# Patient Record
Sex: Female | Born: 1970 | Race: Black or African American | Hispanic: No | Marital: Single | Smoking: Former smoker
Health system: Southern US, Community
[De-identification: ages and names within clinical notes are randomized; demographics above are authoritative.]

## PROBLEM LIST (undated history)

## (undated) ENCOUNTER — Emergency Department (HOSPITAL_COMMUNITY): Payer: BC Managed Care – PPO

## (undated) DIAGNOSIS — I729 Aneurysm of unspecified site: Secondary | ICD-10-CM

## (undated) DIAGNOSIS — E119 Type 2 diabetes mellitus without complications: Secondary | ICD-10-CM

## (undated) DIAGNOSIS — E785 Hyperlipidemia, unspecified: Secondary | ICD-10-CM

## (undated) DIAGNOSIS — K219 Gastro-esophageal reflux disease without esophagitis: Secondary | ICD-10-CM

## (undated) DIAGNOSIS — R42 Dizziness and giddiness: Secondary | ICD-10-CM

## (undated) HISTORY — DX: Gastro-esophageal reflux disease without esophagitis: K21.9

## (undated) HISTORY — PX: WISDOM TOOTH EXTRACTION: SHX21

## (undated) HISTORY — DX: Aneurysm of unspecified site: I72.9

## (undated) HISTORY — PX: CHOLECYSTECTOMY: SHX55

## (undated) HISTORY — PX: POLYPECTOMY: SHX149

## (undated) HISTORY — PX: COLONOSCOPY: SHX174

## (undated) HISTORY — DX: Type 2 diabetes mellitus without complications: E11.9

## (undated) HISTORY — PX: HERNIA REPAIR: SHX51

## (undated) HISTORY — DX: Hyperlipidemia, unspecified: E78.5

## (undated) HISTORY — DX: Dizziness and giddiness: R42

---

## 1999-04-25 ENCOUNTER — Emergency Department (HOSPITAL_COMMUNITY): Admission: EM | Admit: 1999-04-25 | Discharge: 1999-04-25 | Payer: Self-pay | Admitting: Emergency Medicine

## 2003-10-13 ENCOUNTER — Encounter: Payer: Self-pay | Admitting: Gastroenterology

## 2003-11-15 ENCOUNTER — Observation Stay (HOSPITAL_COMMUNITY): Admission: RE | Admit: 2003-11-15 | Discharge: 2003-11-16 | Payer: Self-pay | Admitting: Surgery

## 2003-11-15 ENCOUNTER — Encounter (INDEPENDENT_AMBULATORY_CARE_PROVIDER_SITE_OTHER): Payer: Self-pay | Admitting: Specialist

## 2004-05-02 ENCOUNTER — Ambulatory Visit: Payer: Self-pay | Admitting: Internal Medicine

## 2004-10-03 ENCOUNTER — Ambulatory Visit: Payer: Self-pay | Admitting: Internal Medicine

## 2004-11-28 ENCOUNTER — Ambulatory Visit: Payer: Self-pay | Admitting: Internal Medicine

## 2005-12-12 ENCOUNTER — Ambulatory Visit: Payer: Self-pay | Admitting: Internal Medicine

## 2006-02-18 ENCOUNTER — Ambulatory Visit: Payer: Self-pay | Admitting: Internal Medicine

## 2006-02-27 ENCOUNTER — Ambulatory Visit: Payer: Self-pay | Admitting: *Deleted

## 2006-03-06 ENCOUNTER — Ambulatory Visit: Payer: Self-pay | Admitting: *Deleted

## 2006-03-08 ENCOUNTER — Ambulatory Visit: Payer: Self-pay | Admitting: *Deleted

## 2006-12-30 ENCOUNTER — Emergency Department (HOSPITAL_COMMUNITY): Admission: EM | Admit: 2006-12-30 | Discharge: 2006-12-30 | Payer: Self-pay | Admitting: Emergency Medicine

## 2007-02-11 ENCOUNTER — Encounter: Admission: RE | Admit: 2007-02-11 | Discharge: 2007-02-11 | Payer: Self-pay | Admitting: Obstetrics and Gynecology

## 2007-09-04 ENCOUNTER — Ambulatory Visit: Payer: Self-pay | Admitting: Internal Medicine

## 2007-09-04 DIAGNOSIS — K219 Gastro-esophageal reflux disease without esophagitis: Secondary | ICD-10-CM | POA: Insufficient documentation

## 2007-12-08 ENCOUNTER — Ambulatory Visit: Payer: Self-pay | Admitting: Internal Medicine

## 2007-12-08 DIAGNOSIS — J029 Acute pharyngitis, unspecified: Secondary | ICD-10-CM

## 2007-12-08 LAB — CONVERTED CEMR LAB: Rapid Strep: NEGATIVE

## 2007-12-16 ENCOUNTER — Encounter (INDEPENDENT_AMBULATORY_CARE_PROVIDER_SITE_OTHER): Payer: Self-pay | Admitting: *Deleted

## 2007-12-16 ENCOUNTER — Telehealth (INDEPENDENT_AMBULATORY_CARE_PROVIDER_SITE_OTHER): Payer: Self-pay | Admitting: *Deleted

## 2007-12-16 ENCOUNTER — Ambulatory Visit: Payer: Self-pay | Admitting: Internal Medicine

## 2008-03-16 ENCOUNTER — Encounter: Admission: RE | Admit: 2008-03-16 | Discharge: 2008-03-16 | Payer: Self-pay | Admitting: Obstetrics and Gynecology

## 2009-04-13 ENCOUNTER — Encounter: Admission: RE | Admit: 2009-04-13 | Discharge: 2009-04-13 | Payer: Self-pay | Admitting: Obstetrics and Gynecology

## 2009-05-12 ENCOUNTER — Emergency Department (HOSPITAL_COMMUNITY): Admission: EM | Admit: 2009-05-12 | Discharge: 2009-05-12 | Payer: Self-pay | Admitting: Family Medicine

## 2009-08-15 ENCOUNTER — Ambulatory Visit: Payer: Self-pay | Admitting: Internal Medicine

## 2009-08-15 DIAGNOSIS — R1310 Dysphagia, unspecified: Secondary | ICD-10-CM | POA: Insufficient documentation

## 2009-08-15 DIAGNOSIS — F329 Major depressive disorder, single episode, unspecified: Secondary | ICD-10-CM

## 2009-08-18 ENCOUNTER — Ambulatory Visit: Payer: Self-pay | Admitting: Gastroenterology

## 2009-08-18 ENCOUNTER — Encounter (INDEPENDENT_AMBULATORY_CARE_PROVIDER_SITE_OTHER): Payer: Self-pay | Admitting: *Deleted

## 2009-08-18 DIAGNOSIS — Z9089 Acquired absence of other organs: Secondary | ICD-10-CM | POA: Insufficient documentation

## 2009-08-18 DIAGNOSIS — K5909 Other constipation: Secondary | ICD-10-CM

## 2009-08-18 LAB — CONVERTED CEMR LAB
ALT: 17 units/L (ref 0–35)
AST: 20 units/L (ref 0–37)
Albumin: 4.1 g/dL (ref 3.5–5.2)
Alkaline Phosphatase: 76 units/L (ref 39–117)
BUN: 6 mg/dL (ref 6–23)
Basophils Absolute: 0 10*3/uL (ref 0.0–0.1)
Basophils Relative: 0.4 % (ref 0.0–3.0)
Bilirubin, Direct: 0.2 mg/dL (ref 0.0–0.3)
CO2: 30 meq/L (ref 19–32)
Calcium: 9.3 mg/dL (ref 8.4–10.5)
Chloride: 105 meq/L (ref 96–112)
Creatinine, Ser: 0.8 mg/dL (ref 0.4–1.2)
Eosinophils Absolute: 0.4 10*3/uL (ref 0.0–0.7)
Eosinophils Relative: 3.5 % (ref 0.0–5.0)
Ferritin: 109.2 ng/mL (ref 10.0–291.0)
Folate: 6.7 ng/mL
GFR calc non Af Amer: 101.62 mL/min (ref >60)
Glucose, Bld: 86 mg/dL (ref 70–99)
HCT: 39.1 % (ref 36.0–46.0)
Hemoglobin: 13.3 g/dL (ref 12.0–15.0)
Iron: 53 ug/dL (ref 42–145)
Lymphocytes Relative: 35.7 % (ref 12.0–46.0)
Lymphs Abs: 4 10*3/uL (ref 0.7–4.0)
MCHC: 33.9 g/dL (ref 30.0–36.0)
MCV: 93.5 fL (ref 78.0–100.0)
Monocytes Absolute: 1.1 10*3/uL — ABNORMAL HIGH (ref 0.1–1.0)
Monocytes Relative: 9.5 % (ref 3.0–12.0)
Neutro Abs: 5.8 10*3/uL (ref 1.4–7.7)
Neutrophils Relative %: 50.9 % (ref 43.0–77.0)
Platelets: 237 10*3/uL (ref 150.0–400.0)
Potassium: 4.4 meq/L (ref 3.5–5.1)
RBC: 4.18 M/uL (ref 3.87–5.11)
RDW: 12.5 % (ref 11.5–14.6)
Saturation Ratios: 14.4 % — ABNORMAL LOW (ref 20.0–50.0)
Sodium: 140 meq/L (ref 135–145)
TSH: 1.99 microintl units/mL (ref 0.35–5.50)
Total Bilirubin: 0.5 mg/dL (ref 0.3–1.2)
Total Protein: 7.6 g/dL (ref 6.0–8.3)
Transferrin: 263.7 mg/dL (ref 212.0–360.0)
Vitamin B-12: 645 pg/mL (ref 211–911)
WBC: 11.3 10*3/uL — ABNORMAL HIGH (ref 4.5–10.5)

## 2009-08-29 ENCOUNTER — Ambulatory Visit: Payer: Self-pay | Admitting: Gastroenterology

## 2009-08-31 ENCOUNTER — Telehealth: Payer: Self-pay | Admitting: Gastroenterology

## 2009-08-31 ENCOUNTER — Encounter: Payer: Self-pay | Admitting: Gastroenterology

## 2009-08-31 DIAGNOSIS — K449 Diaphragmatic hernia without obstruction or gangrene: Secondary | ICD-10-CM | POA: Insufficient documentation

## 2009-09-01 ENCOUNTER — Telehealth: Payer: Self-pay | Admitting: Gastroenterology

## 2009-09-15 ENCOUNTER — Ambulatory Visit (HOSPITAL_COMMUNITY): Admission: RE | Admit: 2009-09-15 | Discharge: 2009-09-15 | Payer: Self-pay | Admitting: Gastroenterology

## 2009-10-07 ENCOUNTER — Encounter: Payer: Self-pay | Admitting: Internal Medicine

## 2009-11-25 ENCOUNTER — Ambulatory Visit (HOSPITAL_COMMUNITY): Admission: RE | Admit: 2009-11-25 | Discharge: 2009-11-25 | Payer: Self-pay | Admitting: Surgery

## 2009-12-01 ENCOUNTER — Ambulatory Visit: Payer: Self-pay | Admitting: Internal Medicine

## 2009-12-01 DIAGNOSIS — R0602 Shortness of breath: Secondary | ICD-10-CM

## 2009-12-01 DIAGNOSIS — R079 Chest pain, unspecified: Secondary | ICD-10-CM

## 2009-12-09 ENCOUNTER — Observation Stay (HOSPITAL_COMMUNITY): Admission: AD | Admit: 2009-12-09 | Discharge: 2009-12-13 | Payer: Self-pay | Admitting: Surgery

## 2009-12-30 ENCOUNTER — Ambulatory Visit: Payer: Self-pay | Admitting: Internal Medicine

## 2009-12-30 DIAGNOSIS — H8110 Benign paroxysmal vertigo, unspecified ear: Secondary | ICD-10-CM | POA: Insufficient documentation

## 2009-12-30 DIAGNOSIS — R42 Dizziness and giddiness: Secondary | ICD-10-CM

## 2010-01-05 LAB — HM COLONOSCOPY

## 2010-03-02 ENCOUNTER — Encounter: Payer: Self-pay | Admitting: Gastroenterology

## 2010-03-28 ENCOUNTER — Ambulatory Visit: Payer: Self-pay | Admitting: Internal Medicine

## 2010-03-28 DIAGNOSIS — R519 Headache, unspecified: Secondary | ICD-10-CM | POA: Insufficient documentation

## 2010-03-28 DIAGNOSIS — R51 Headache: Secondary | ICD-10-CM

## 2010-04-17 ENCOUNTER — Encounter: Admission: RE | Admit: 2010-04-17 | Discharge: 2010-04-17 | Payer: Self-pay | Admitting: Obstetrics and Gynecology

## 2010-07-24 LAB — HM PAP SMEAR

## 2010-07-25 ENCOUNTER — Other Ambulatory Visit: Payer: Self-pay | Admitting: Internal Medicine

## 2010-07-25 ENCOUNTER — Ambulatory Visit
Admission: RE | Admit: 2010-07-25 | Discharge: 2010-07-25 | Payer: Self-pay | Source: Home / Self Care | Attending: Internal Medicine | Admitting: Internal Medicine

## 2010-07-25 LAB — LIPID PANEL
Cholesterol: 189 mg/dL (ref 0–200)
HDL: 46.9 mg/dL (ref >39.00)
LDL Cholesterol: 120 mg/dL — ABNORMAL HIGH (ref 0–99)
Total CHOL/HDL Ratio: 4
Triglycerides: 113 mg/dL (ref 0.0–149.0)
VLDL: 22.6 mg/dL (ref 0.0–40.0)

## 2010-07-25 LAB — HEMOGLOBIN A1C: Hgb A1c MFr Bld: 5.7 % (ref 4.6–6.5)

## 2010-07-25 LAB — CONVERTED CEMR LAB: Blood Glucose, Fingerstick: 94

## 2010-07-27 ENCOUNTER — Telehealth: Payer: Self-pay

## 2010-08-01 NOTE — Assessment & Plan Note (Signed)
Summary: gerd/chest congestion/njr   Vital Signs:  Patient profile:   40 year old female Weight:      191 pounds Temp:     98.0 degrees F oral BP sitting:   100 / 70  (left arm) Cuff size:   regular  Vitals Entered By: Duard Brady LPN (December 01, 452 11:21 AM) CC: c/o sob, increased heartburn , tightness in chest last night - is having hiatal hernis surg - 12/09/09 Is Patient Diabetic? No   CC:  c/o sob, increased heartburn , and tightness in chest last night - is having hiatal hernis surg - 12/09/09.  History of Present Illness: 40 year old patient who is scheduled for a length of reflux surgery in 8 days.  Last night she awoke with severe heartburn symptoms and noticed some chest tightness, shortness of breath, cough, and wheezing.  Symptoms lasted two to 3 minutes.  She is somewhat concerned about the episode that she had never had wheezing, cough, in the past.  Denies any cardiac history, and denies any history of exertional chest pain.  Today she feels well.  An EKG was reviewed and was normal  Allergies (verified): No Known Drug Allergies  Past History:  Past Medical History: Reviewed history from 08/15/2009 and no changes required. 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  The patient denies anorexia, fever, weight loss, weight gain, vision loss, decreased hearing, hoarseness, chest pain, syncope, dyspnea on exertion, peripheral edema, prolonged cough, headaches, hemoptysis, abdominal pain, melena, hematochezia, hematuria, incontinence, genital sores, muscle weakness, suspicious skin lesions, transient blindness, difficulty walking, depression, unusual weight change, abnormal bleeding, enlarged lymph nodes, angioedema, and breast masses.    Physical Exam  General:  Well-developed,well-nourished,in no acute distress; alert,appropriate and cooperative throughout  examination Head:  Normocephalic and atraumatic without obvious abnormalities. No apparent alopecia or balding. Eyes:  No corneal or conjunctival inflammation noted. EOMI. Perrla. Funduscopic exam benign, without hemorrhages, exudates or papilledema. Vision grossly normal. Mouth:  Oral mucosa and oropharynx without lesions or exudates.  Teeth in good repair. Neck:  No deformities, masses, or tenderness noted. Lungs:  Normal respiratory effort, chest expands symmetrically. Lungs are clear to auscultation, no crackles or wheezes.  O2 saturation 99% Heart:  Normal rate and regular rhythm. S1 and S2 normal without gallop, murmur, click, rub or other extra sounds.  pulse rate 74 Abdomen:  Bowel sounds positive,abdomen soft and non-tender without masses, organomegaly or hernias noted. Msk:  No deformity or scoliosis noted of thoracic or lumbar spine.   Extremities:  No clubbing, cyanosis, edema, or deformity noted with normal full range of motion of all joints.     Impression & Recommendations:  Problem # 1:  DYSPNEA (ICD-786.05)  Orders: EKG w/ Interpretation (93000)  Problem # 2:  GERD (ICD-530.81)  The following medications were removed from the medication list:    Zantac 75 75 Mg Tabs (Ranitidine hcl) .Marland Kitchen... 2 qd    Aciphex 20 Mg Tbec (Rabeprazole sodium) .Marland Kitchen... Take 1 each day 30 minutes before meals Her updated medication list for this problem includes:    Protonix 40 Mg Tbec (Pantoprazole sodium) ..... Qd  Orders: EKG w/ Interpretation (93000)  Complete Medication List: 1)  Phillips Colon Health Caps (Probiotic product) .... Two times a day 2)  Protonix 40 Mg Tbec (Pantoprazole sodium) .... Qd 3)  Percocet 10-325 Mg Tabs (Oxycodone-acetaminophen) .... Prn  Patient Instructions:  1)  Avoid foods high in acid (tomatoes, citrus juices, spicy foods). Avoid eating within two hours of lying down or before exercising. Do not over eat; try smaller more frequent meals. Elevate head of  bed twelve inches when sleeping. 2)  continue taking proton pump inhibitor 3)  Surgery with  Dr. Daphine Deutscher as scheduled 4)  Please schedule a follow-up appointment as needed.

## 2010-08-01 NOTE — Assessment & Plan Note (Signed)
Summary: NEW PT EST // RS   Vital Signs:  Patient profile:   40 year old female Height:      65 inches Weight:      190 pounds BMI:     31.73 Temp:     98.6 degrees F oral BP sitting:   120 / 70  (right arm) Cuff size:   regular  Vitals Entered By: Duard Brady LPN (August 15, 2009 1:33 PM) CC: new to [facility] - to estalbish , c/o/gerd, choking with swallowing    CC:  new to [facility] - to estalbish , c/o/gerd, and choking with swallowing .  History of Present Illness:  40 year old patient who is in today to establish with this office.  She has a history of gastroesophageal reflux disease and for the past 4 weeks has had some swallowing difficulty.  she has been using OTC ranitidine, without much affect.  She did discontinue tobacco use, about 3 months ago.  She takes no chronic medications, but has been here with protonix  in the past.  There is been no weight loss or pain with swallowing.   she is quite anxious about the swallowing difficulty.  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 DM - F, M, sister, niece, M. aunt CAD - niece, father decreased 82 of MI stroke - niece HTN - F, niece, sister breast Ca - no colon Ca -no  Father- age late 2s s/p CABG, Dyslipidemia Mother died age 68- DM complications 3    brothers-  4 sisters- DM (3)   Social History: Reviewed history from 09/04/2007 and no changes required. 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  The patient denies anorexia, fever, weight loss, weight gain, vision  loss, decreased hearing, hoarseness, chest pain, syncope, dyspnea on exertion, peripheral edema, prolonged cough, headaches, hemoptysis, abdominal pain, melena, hematochezia, hematuria, incontinence, genital sores, muscle weakness, suspicious skin lesions, transient blindness, difficulty walking, depression, unusual weight change, abnormal bleeding, enlarged lymph nodes, angioedema, and breast masses.    Physical Exam  General:  overweight-appearing.  120/70overweight-appearing.   Head:  Normocephalic and atraumatic without obvious abnormalities. No apparent alopecia or balding. Eyes:  No corneal or conjunctival inflammation noted. EOMI. Perrla. Funduscopic exam benign, without hemorrhages, exudates or papilledema. Vision grossly normal. Ears:  External ear exam shows no significant lesions or deformities.  Otoscopic examination reveals clear canals, tympanic membranes are intact bilaterally without bulging, retraction, inflammation or discharge. Hearing is grossly normal bilaterally. Nose:  External nasal examination shows no deformity or inflammation. Nasal mucosa are pink and moist without lesions or exudates. Mouth:  Oral mucosa and oropharynx without lesions or exudates.  Teeth in good repair. Neck:  No deformities, masses, or tenderness noted. Chest Wall:  No deformities, masses, or tenderness noted. Lungs:  Normal respiratory effort, chest expands symmetrically. Lungs are clear to auscultation, no crackles or wheezes. Heart:  Normal rate and regular rhythm. S1 and S2 normal without gallop, murmur, click, rub or other extra sounds. Abdomen:  Bowel sounds positive,abdomen soft and non-tender without masses, organomegaly or  hernias noted. Msk:  No deformity or scoliosis noted of thoracic or lumbar spine.   Pulses:  R and L carotid,radial,femoral,dorsalis pedis and posterior tibial pulses are full and equal bilaterally Extremities:  No clubbing, cyanosis, edema, or deformity noted with normal  full range of motion of all joints.   Skin:  Intact without suspicious lesions or rashes Cervical Nodes:  No lymphadenopathy noted Psych:  Cognition and judgment appear intact. Alert and cooperative with normal attention span and concentration. No apparent delusions, illusions, hallucinations   Impression & Recommendations:  Problem # 1:  GERD (ICD-530.81)  The following medications were removed from the medication list:    Protonix 40 Mg Pack (Pantoprazole sodium) ..... One tab by mouth once daily. Her updated medication list for this problem includes:    Zantac 75 75 Mg Tabs (Ranitidine hcl) .Marland Kitchen... 2 qd    Pantoprazole Sodium 40 Mg Tbec (Pantoprazole sodium) ..... One daily  The following medications were removed from the medication list:    Protonix 40 Mg Pack (Pantoprazole sodium) ..... One tab by mouth once daily. Her updated medication list for this problem includes:    Zantac 75 75 Mg Tabs (Ranitidine hcl) .Marland Kitchen... 2 qd    Pantoprazole Sodium 40 Mg Tbec (Pantoprazole sodium) ..... One daily  Problem # 2:  DYSPHAGIA UNSPECIFIED (ICD-787.20)  Orders: Gastroenterology Referral (GI)  Complete Medication List: 1)  Zantac 75 75 Mg Tabs (Ranitidine hcl) .... 2 qd 2)  Pantoprazole Sodium 40 Mg Tbec (Pantoprazole sodium) .... One daily  Patient Instructions: 1)  Avoid foods high in acid (tomatoes, citrus juices, spicy foods). Avoid eating within two hours of lying down or before exercising. Do not over eat; try smaller more frequent meals. Elevate head of bed twelve inches when sleeping. 2)  It is important that you exercise regularly at least 20 minutes 5 times a week. If you develop chest pain, have severe difficulty breathing, or feel very tired , stop exercising immediately and seek medical attention. 3)  You need to lose weight. Consider a lower calorie diet and regular exercise.  4)  call if swallowing difficulties persist Prescriptions: PANTOPRAZOLE SODIUM 40 MG TBEC  (PANTOPRAZOLE SODIUM) one daily  #90 x 3   Entered and Authorized by:   Gordy Savers  MD   Signed by:   Gordy Savers  MD on 08/15/2009   Method used:   Print then Give to Patient   RxID:   [id number]

## 2010-08-01 NOTE — Progress Notes (Signed)
Summary: Referral appt.   Phone Note From Other Clinic   Caller: [facility]  [place]  [phone] Call For: Dr. Jarold Motto Summary of Call: She is scheduled w/Dr. Daphine Deutscher on 10-07-09 at 10:35. Need pt. there a 1/2 hour early for paperwork. Initial call taken by: Karna Christmas,  September 01, 2009 3:44 PM  Follow-up for Phone Call        Lm for pt that appt is sch and she will get paperwork from Dr. Ermalene Searing office. Follow-up by: Ashok Cordia RN,  September 02, 2009 1:37 PM

## 2010-08-01 NOTE — Procedures (Signed)
Summary: EGD   EGD  Procedure date:  10/13/2003  Findings:      Location: [facility]    EGD  Procedure date:  10/13/2003  Findings:      Location: [facility]   Patient Name: Becky Moore, Becky Moore MRN:  Procedure Procedures: Panendoscopy (EGD) CPT: 43235.    with biopsy(s)/brushing(s). CPT: D1846139.  Personnel: Endoscopist: Vania Rea. Jarold Motto, MD.  Exam Location: Exam performed in Outpatient Clinic. Outpatient  Patient Consent: Procedure, Alternatives, Risks and Benefits discussed, consent obtained, from patient. Consent was obtained by the RN.  Indications Symptoms: Abdominal pain, location: epigastric. Reflux symptoms  Comments: She hasw numerous gallstones on ultrasound exam!!!!  History  Current Medications: Patient is not currently taking Coumadin.  Pre-Exam Physical: Performed Oct 13, 2003  Cardio-pulmonary exam, Abdominal exam, Extremity exam, Mental status exam WNL.  Exam Exam Info: Maximum depth of insertion Duodenum, intended Duodenum. Patient position: on left side. Duration of exam: 15 minutes. Vocal cords visualized. Gastric retroflexion performed. Images taken. ASA Classification: I. Tolerance: excellent.  Sedation Meds: Patient assessed and found to be appropriate for moderate (conscious) sedation. Fentanyl 50 mcg. given IV. Versed 5 mg. given IV. Cetacaine Spray 2 sprays given aerosolized.  Monitoring: BP and pulse monitoring done. Oximetry used. Supplemental O2 given at 2 Liters.  Instrument(s): GIF 160. Serial [id number].   Findings - Normal: Proximal Esophagus to Distal Esophagus. Not Seen: Tumor. Barrett's esophagus. Esophageal inflammation. Stricture. Varices.  - HIATAL HERNIA: Prolapsing, 4 cms. in length. ICD9: Hernia, Hiatal: 553.3. - MUCOSAL ABNORMALITY: Cardia to Antrum. Nodularity present. Erythematous mucosa. Red spots present. Granular mucosa. RUT done, results pending. ICD9: Gastritis, Unspecified:  535.50.  - Normal: Antrum to Duodenal Apex. Not Seen: Ulcer. Mucosal abnormality. Foreign body.   Assessment  Diagnoses: 535.50: Gastritis, Unspecified.  553.3: Hernia, Hiatal. Chronic GERD.Marland Kitchenno Barrett's mucosa noted here.   Events  Unplanned Intervention: No unplanned interventions were required.  Plans Medication(s): Await pathology. Continue current medications.  Patient Education: Patient given standard instructions for: Reflux.  Disposition: After procedure patient sent to recovery. After recovery patient sent home.  Scheduling: Surgery, to Currie Paris, MD, ASAP   Comments: Needs laproscopic cholecystectomy done.   cc: Willow Ora, MD     Beulah Gandy, MD   This report was created from the original endoscopy report, which was reviewed and signed by the above listed endoscopist.

## 2010-08-01 NOTE — Procedures (Signed)
Summary: Upper Endoscopy  Patient: Becky Moore Note: All result statuses are Final unless otherwise noted.  Tests: (1) Upper Endoscopy (EGD)   EGD Upper Endoscopy       DONE     [facility]     [address]     [place], [place]  [address]           ENDOSCOPY PROCEDURE REPORT           PATIENT:  [name], Hand  MR#:  [id number]     BIRTHDATE:  July 16, 1970, 39 yrs. old  GENDER:  female           ENDOSCOPIST:  Vania Rea. Jarold Motto, MD, [facility]     Referred by:           PROCEDURE DATE:  08/29/2009     PROCEDURE:  Esophagogastroscopy     ASA CLASS:  Class I     INDICATIONS:  GERD, dysphagia           MEDICATIONS:   Fentanyl 25 mcg IV, Versed 3 mg IV     TOPICAL ANESTHETIC:  Exactacain Spray           DESCRIPTION OF PROCEDURE:   After the risks benefits and     alternatives of the procedure were thoroughly explained, informed     consent was obtained.  The LB GIF-H180 [id number] endoscope was     introduced through the mouth and advanced to the second portion of     the duodenum, limited by retching and gagging. SEVER RETCHING AND     FUNDAL PROLAPSE.  The instrument was slowly withdrawn as the     mucosa was fully examined.     <<PROCEDUREIMAGES>>           A hiatal hernia was found. LARGE 8CM HH AND PROLAPSE OF FUNDUS     INTO DISTAL ESOPHAGUS.NO STRICTURE NOTED.  Normal duodenal folds     were noted.  The stomach was entered and closely examined. The     antrum, angularis, and lesser curvature were well visualized,     including a retroflexed view of the cardia and fundus. The stomach     wall was normally distensable. The scope passed easily through the     pylorus into the duodenum.    Retroflexed views revealed unable to     retroflex.    The scope was then withdrawn from the patient and     the procedure completed.           COMPLICATIONS:  None           ENDOSCOPIC IMPRESSION:     1) Hiatal hernia     2) Normal duodenal folds     3) Normal stomach     4)  Unable to retroflex     VERY LARGE HH AND FUNDAL PROLAPSE.WILL NEED     FUNDOPLICATION.REFERRAL DR. MARTIN.     RECOMMENDATIONS:     1) continue PPI     2) My office will arrange for you to have a Barium Esophagram     performed. This is a radiology test to examine your esophagus.     SURGICAL REFERRAL FOR FUNDOPLICATION.           REPEAT EXAM:  No           ______________________________     Vania Rea. Jarold Motto, MD, Clementeen Graham           CC:  Luretha Murphy, MD, Theron Arista  Lysle Dingwall, MD           n.     Rosalie DoctorVania Rea. Cylie Dor at 08/29/2009 03:12 PM           Joice Lofts, [phone]  Note: An exclamation mark (!) indicates a result that was not dispersed into the flowsheet. Document Creation Date: 08/29/2009 3:12 PM _______________________________________________________________________  (1) Order result status: Final Collection or observation date-time: 08/29/2009 15:02 Requested date-time:  Receipt date-time:  Reported date-time:  Referring Physician:   Ordering Physician: Sheryn Bison [phone]) Specimen Source:  Source: Launa Grill Order Number: [id number] Lab site:   Appended Document: Upper Endoscopy    Clinical Lists Changes  Problems: Added new problem of HIATAL HERNIA (ICD-553.3) Orders: Added new Test order of Barium Swallow (Barium Swallow) - Signed

## 2010-08-01 NOTE — Progress Notes (Signed)
Summary: Barium swallow appt   Phone Note Outgoing Call   Call placed by: Ashok Cordia RN,  August 31, 2009 9:01 AM Summary of Call: Per Dr. Jarold Motto, pt needs Barium Swallow, esophagram.  And referral to Dr. Zenon Mayo for fundoplication.  LM for pt to call. Initial call taken by: Ashok Cordia RN,  August 31, 2009 9:02 AM  Follow-up for Phone Call        Appt for BS sch at [facility] for 09/15/09 at 10:00.   Records will be sent to Dr. Daphine Deutscher.  Pt notified. Follow-up by: Ashok Cordia RN,  August 31, 2009 10:38 AM

## 2010-08-01 NOTE — Letter (Signed)
Summary: [facility]   Imported By: Lester Lyons 03/22/2010 08:48:04  _____________________________________________________________________  External Attachment:    Type:   Image     Comment:   External Document

## 2010-08-01 NOTE — Letter (Signed)
Summary: [facility]   Imported By: Maryln Gottron 10/20/2009 15:39:12  _____________________________________________________________________  External Attachment:    Type:   Image     Comment:   External Document

## 2010-08-01 NOTE — Letter (Signed)
Summary: Patient Notice- Polyp Results  [facility]  [address] [place], [place] [address]   Phone: [phone]  Fax: [phone]        August 31, 2009 MRN: [id number]    [facility] [address] [place], [place]  [address]    Dear Ms. Cisse,  I am pleased to inform you that the colon polyp(s) removed during your recent colonoscopy was (were) found to be benign (no cancer detected) upon pathologic examination.  I recommend you have a repeat colonoscopy examination in 3_ years to look for recurrent polyps, as having colon polyps increases your risk for having recurrent polyps or even colon cancer in the future.  Should you develop new or worsening symptoms of abdominal pain, bowel habit changes or bleeding from the rectum or bowels, please schedule an evaluation with either your primary care physician or with me.  Additional information/recommendations:  xx__ No further action with gastroenterology is needed at this time. Please      follow-up with your primary care physician for your other healthcare      needs.  __ Please call [phone] to schedule a return visit to review your      situation.  __ Please keep your follow-up visit as already scheduled.  __ Continue treatment plan as outlined the day of your exam.  Please call us if you are having persistent problems or have questions about your condition that have not been fully answered at this time.  Sincerely,  Mardella Layman MD [facility]  This letter has been electronically signed by your physician.  Appended Document: Patient Notice- Polyp Results letter mailed 3.3.11

## 2010-08-01 NOTE — Assessment & Plan Note (Signed)
Summary: DYSPHAGIA/YF    History of Present Illness Visit Type: Initial Consult Primary GI MD: Sheryn Bison MD FACP FAGA Requesting Provider: Eleonore Chiquito, MD Chief Complaint: dysphagia, patient is getting choked when eating , food getting stuck in throat History of Present Illness:   Very Pleasant 40 year old [place] American female with chronic GERD status post cholecystectomy in 2005. She's had previous endoscopy which was unremarkable also 5 years ago. Biopsy at that time for H. pylori was negative.  She's had years of rather classic daytime acid reflux with new onset solid food dysphagia over the last several months mostly in the upper esophageal area for bread and meats. She been on Zantac without much improvement. She denies any hepatobiliary complaints. She has chronic constipation with gas and bloating but denies melena or hematochezia. Review of her chart does not show recent laboratory parameters. She denies abuse of alcohol, NSAIDs, and quit smoking 3 months ago with an associated significant weight gain. She denies lactose intolerance or other food intolerances. Family history is otherwise noncontributory.   GI Review of Systems    Reports acid reflux, belching, bloating, dysphagia with solids, and  vomiting.      Denies abdominal pain, chest pain, dysphagia with liquids, heartburn, loss of appetite, nausea, weight loss, and  weight gain.        Denies anal fissure, black tarry stools, change in bowel habit, constipation, diarrhea, diverticulosis, fecal incontinence, heme positive stool, hemorrhoids, irritable bowel syndrome, jaundice, light color stool, liver problems, rectal bleeding, and  rectal pain. Preventive Screening-Counseling & Management  Alcohol-Tobacco     Smoking Status: quit      Drug Use:  no.      Current Medications (verified): 1)  Zantac 75 75 Mg Tabs (Ranitidine Hcl) .... 2 Qd  Allergies (verified): No Known Drug Allergies  Past  History:  Past medical, surgical, family and social histories (including risk factors) reviewed for relevance to current acute and chronic problems.  Past Medical History: Reviewed history from 08/15/2009 and no changes required. G2 P1 abortion x 1 cervical dysplasia  GERD Depression  Past Surgical History: Reviewed history from 08/15/2009 and no changes required. Cholecystectomy 2005  Family History: Reviewed history from 08/15/2009 and no changes required. DM - F, M, sister, niece, M. aunt CAD - niece, father decreased 83 of MI stroke - niece HTN - F, niece, sister breast Ca - no colon Ca -no  Father- age late 26s s/p CABG, Dyslipidemia Mother died age 40- DM complications 3    brothers-  4 sisters- DM (3)   Social History: Reviewed history from 08/15/2009 and no changes required. 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  Alcohol Use - no Daily Caffeine Use 2-3 Illicit Drug Use - no Smoking Status:  quit Drug Use:  no  Review of Systems       The patient complains of anxiety-new and back pain.  The patient denies allergy/sinus, anemia, arthritis/joint pain, blood in urine, breast changes/lumps, change in vision, confusion, cough, coughing up blood, depression-new, fainting, fatigue, fever, headaches-new, hearing problems, heart murmur, heart rhythm changes, itching, menstrual pain, muscle pains/cramps, night sweats, nosebleeds, pregnancy symptoms, shortness of breath, skin rash, sleeping problems, sore throat, swelling of feet/legs, swollen lymph glands, thirst - excessive , urination - excessive , urination changes/pain, urine leakage, vision changes, and voice change.    Vital Signs:  Patient profile:   40 year old female Height:  65 inches Weight:      193.50 pounds BMI:     32.32 Pulse rate:   80 / minute Pulse rhythm:   regular BP sitting:   104 / 76  (left arm) Cuff size:   regular  Vitals Entered  By: June McMurray CMA Duncan Dull) (August 18, 2009 10:11 AM)  Physical Exam  General:  Well developed, well nourished, no acute distress.healthy appearing.   Head:  Normocephalic and atraumatic. Eyes:  PERRLA, no icterus.exam deferred to patient's ophthalmologist.   Mouth:  No deformity or lesions, dentition normal. Neck:  Supple; no masses or thyromegaly. Lungs:  Clear throughout to auscultation. Heart:  Regular rate and rhythm; no murmurs, rubs,  or bruits. Abdomen:  Soft, nontender and nondistended. No masses, hepatosplenomegaly or hernias noted. Normal bowel sounds. Rectal:  deferred until time of colonoscopy.   Msk:  Symmetrical with no gross deformities. Normal posture. Pulses:  Normal pulses noted. Extremities:  No clubbing, cyanosis, edema or deformities noted. Neurologic:  Alert and  oriented x4;  grossly normal neurologically. Skin:  Intact without significant lesions or rashes. Cervical Nodes:  No significant cervical adenopathy. Inguinal Nodes:  No significant inguinal adenopathy. Psych:  Alert and cooperative. Normal mood and affect.   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 I placed her on a standard anti-reflux regime and started AcipHex 20 mg 30 minutes before breakfast with outpatient endoscopy scheduled at her convenience. Screening labs have also been ordered. Orders: TLB-CBC Platelet - w/Differential (85025-CBCD) TLB-BMP (Basic Metabolic Panel-BMET) (80048-METABOL) TLB-Hepatic/Liver Function Pnl (80076-HEPATIC) TLB-TSH (Thyroid Stimulating Hormone) (84443-TSH) TLB-B12, Serum-Total ONLY (16109-U04) TLB-Ferritin (82728-FER) TLB-Folic Acid (Folate) (82746-FOL) TLB-IBC Pnl (Iron/FE;Transferrin) (83550-IBC) Colon/Endo (Colon/Endo)  Problem # 2:  OTHER CONSTIPATION (ICD-564.09) Assessment: Deteriorated Outpatient colonoscopy  and labs ordered. [facility] b.i.d[facility] suggested to give her fiber and a trial of probiotics. Orders: TLB-CBC Platelet - w/Differential (85025-CBCD) TLB-BMP (Basic Metabolic Panel-BMET) (80048-METABOL) TLB-Hepatic/Liver Function Pnl (80076-HEPATIC) TLB-TSH (Thyroid Stimulating Hormone) (84443-TSH) TLB-B12, Serum-Total ONLY (54098-J19) TLB-Ferritin (82728-FER) TLB-Folic Acid (Folate) (82746-FOL) TLB-IBC Pnl (Iron/FE;Transferrin) (83550-IBC) Colon/Endo (Colon/Endo)  Problem # 3:  CHOLECYSTECTOMY, LAPAROSCOPIC, HX OF (ICD-V45.79) Assessment: Unchanged previous surgery by Dr. Cyndia Bent. Repeat liver function tests have been ordered.  Patient Instructions: 1)  Copy sent to : Dr. Beverely Low and Dr. Jamey Ripa at [facility] surgery 2)  Please continue current medications.  3)  Labs Pending 4)  AcipHex 20 mg q.a.m. 5)  Avoid foods high in acid content ( tomatoes, citrus juices, spicy foods) . Avoid eating within 3 to 4 hours of lying down or before exercising. Do not over eat; try smaller more frequent meals. Elevate head of bed four inches when sleeping.  6)  Constipation and Hemorrhoids brochure given.  7)  Colonoscopy and Flexible Sigmoidoscopy brochure given.  8)  Upper Endoscopy brochure given.  9)  Fiber Supplements and trial of probiotics 10)  The medication list was reviewed and reconciled.  All changed / newly prescribed medications were explained.  A complete medication list was provided to the patient / caregiver. Prescriptions: ACIPHEX 20 MG  TBEC (RABEPRAZOLE SODIUM) Take 1 each day 30 minutes before meals  #30 x 6   Entered by:   Ashok Cordia RN   Authorized by:   Mardella Layman MD [facility]   Signed by:   Ashok Cordia RN on 08/18/2009   Method used:   Electronically to  [facility]  [address]  [phone]* (retail)       [address]       [place], [place]  [address]       Ph: [phone] or [phone]       Fax:  [phone]   RxID:   [id number] MOVIPREP 100 GM  SOLR (PEG-KCL-NACL-NASULF-NA ASC-C) As per prep instructions.  #1 x 0   Entered by:   Ashok Cordia RN   Authorized by:   Mardella Layman MD [facility]   Signed by:   Ashok Cordia RN on 08/18/2009   Method used:   Electronically to        [facility]  [phone]* (retail)       [address]       [place], [place]  [address]       Ph: [phone] or [phone]       Fax: [phone]   RxID:   [id number]

## 2010-08-01 NOTE — Procedures (Signed)
Summary: Colonoscopy  Patient: Becky Moore Note: All result statuses are Final unless otherwise noted.  Tests: (1) Colonoscopy (COL)   COL Colonoscopy           DONE     [facility]     [address]     [place], [place]  [address]           COLONOSCOPY PROCEDURE REPORT           PATIENT:  Becky Moore, Becky Moore  MR#:  [id number]     BIRTHDATE:  [birth year], 39 yrs. old  GENDER:  female           ENDOSCOPIST:  Vania Rea. Jarold Motto, MD, [facility]     Referred by:           PROCEDURE DATE:  08/29/2009     PROCEDURE:  Colonoscopy with snare polypectomy     ASA CLASS:  Class I     INDICATIONS:  Routine Risk Screening           MEDICATIONS:   Fentanyl 75 mcg IV, Versed 6 mg IV           DESCRIPTION OF PROCEDURE:   After the risks benefits and     alternatives of the procedure were thoroughly explained, informed     consent was obtained.  Digital rectal exam was performed and     revealed no abnormalities.   The LB CF-[id number] endoscope     was introduced through the anus and advanced to the cecum, which     was identified by both the appendix and ileocecal valve, without     limitations.  The quality of the prep was excellent, using     MoviPrep.  The instrument was then slowly withdrawn as the colon     was fully examined.     <<PROCEDUREIMAGES>>           FINDINGS:  ENDOSCOPIC FINDINGS:   There were multiple polyps     identified and removed. in the rectum and sigmoid colon. -     DIMINUTIVE POLYPS SNARE EXCISED AND CAYTERIZED. There were     multiple polyps identified and removed. in the ascending colon.     5-7MM FLAT SOFT POLYPS SNARE EXCISED. Polyps were snared, then     cauterized with monopolar cautery. Retrieval was successful. snare     polyp  This was otherwise a normal examination of the colon.     Retroflexed views in the rectum revealed no abnormalities.    The     scope was then withdrawn from the patient and the procedure     completed.        COMPLICATIONS:  None           ENDOSCOPIC IMPRESSION:     1) Polyps, multiple in the ascending colon     2) Polyps, multiple in the rectum and sigmoid colon     3) Otherwise normal examination     RECOMMENDATIONS:     1) If the polyp(s) removed today are proven to be adenomatous     (pre-cancerous) polyps, you will need a colonoscopy in 3 years.     Otherwise you should continue to follow colorectal cancer     screening guidelines for "routine risk" patients with a     colonoscopy in 10 years.           REPEAT EXAM:  No           ______________________________  Vania Rea. Jarold Motto, MD, Clementeen Graham           CC:  Gordy Savers, MD           n.     Rosalie Doctor:   Vania Rea. Patterson at 08/29/2009 02:58 PM           Joice Lofts, [phone]  Note: An exclamation mark (!) indicates a result that was not dispersed into the flowsheet. Document Creation Date: 08/29/2009 2:59 PM _______________________________________________________________________  (1) Order result status: Final Collection or observation date-time: 08/29/2009 14:51 Requested date-time:  Receipt date-time:  Reported date-time:  Referring Physician:   Ordering Physician: Sheryn Bison [phone]) Specimen Source:  Source: Launa Grill Order Number: [id number] Lab site:   Appended Document: Colonoscopy     Procedures Next Due Date:    Colonoscopy: 08/2012

## 2010-08-01 NOTE — Assessment & Plan Note (Signed)
Summary: consult re: dizzy spells and headaches/cjr   Vital Signs:  Patient profile:   40 year old female Weight:      180 pounds Temp:     98.3 degrees F oral BP sitting:   110 / 70  (right arm) Cuff size:   regular  Vitals Entered By: Duard Brady LPN (March 28, 2010 8:07 AM) CC: c/o dizziness on/off foe weeks , c/o headache Is Patient Diabetic? No   CC:  c/o dizziness on/off foe weeks  and c/o headache.  History of Present Illness: 40 year old patient who is seen today with a chief complaint of both dizziness and vertigo.  She had her initial episode of vertigo approximately 3 months ago.  She continues to have this occasionally, but now has had some occasional dizziness.  She described also episodes of becoming quite lightheaded.  For the past, week she's had mild nonspecific headache.  She has a history of depression, but no current symptoms.  Denies any allergy related symptoms.  She takes no chronic medications.  Allergies (verified): No Known Drug Allergies  Past History:  Past Medical History: Reviewed history from 08/15/2009 and no changes required. G2 P1 abortion x 1 cervical dysplasia  GERD Depression  Past Surgical History: Cholecystectomy  11-2009 EGD 2011 colonoscopy 2011  Review of Systems       The patient complains of headaches.  The patient denies anorexia, fever, weight loss, weight gain, vision loss, decreased hearing, hoarseness, chest pain, syncope, dyspnea on exertion, peripheral edema, prolonged cough, hemoptysis, abdominal pain, melena, hematochezia, severe indigestion/heartburn, hematuria, incontinence, genital sores, muscle weakness, suspicious skin lesions, transient blindness, difficulty walking, depression, unusual weight change, abnormal bleeding, enlarged lymph nodes, angioedema, and breast masses.    Physical Exam  General:  Well-developed,well-nourished,in no acute distress; alert,appropriate and cooperative throughout  examination Head:  Normocephalic and atraumatic without obvious abnormalities. No apparent alopecia or balding. Eyes:  No corneal or conjunctival inflammation noted. EOMI. Perrla. Funduscopic exam benign, without hemorrhages, exudates or papilledema. Vision grossly normal. Ears:  External ear exam shows no significant lesions or deformities.  Otoscopic examination reveals clear canals, tympanic membranes are intact bilaterally without bulging, retraction, inflammation or discharge. Hearing is grossly normal bilaterally. Mouth:  Oral mucosa and oropharynx without lesions or exudates.  Teeth in good repair. Neck:  No deformities, masses, or tenderness noted. Lungs:  Normal respiratory effort, chest expands symmetrically. Lungs are clear to auscultation, no crackles or wheezes. Heart:  Normal rate and regular rhythm. S1 and S2 normal without gallop, murmur, click, rub or other extra sounds. Neurologic:  alert & oriented X3, cranial nerves II-XII intact, strength normal in all extremities, sensation intact to light touch, sensation intact to pinprick, gait normal, finger-to-nose normal, and Romberg negative.   able to do tandem walk without difficultyalert & oriented X3, cranial nerves II-XII intact, strength normal in all extremities, sensation intact to light touch, sensation intact to pinprick, gait normal, finger-to-nose normal, and Romberg negative.     Impression & Recommendations:  Problem # 1:  VERTIGO (ICD-780.4)  Problem # 2:  HEADACHE (ICD-784.0)  Her updated medication list for this problem includes:    Percocet 10-325 Mg Tabs (Oxycodone-acetaminophen) .Marland Kitchen... Prn  Her updated medication list for this problem includes:    Percocet 10-325 Mg Tabs (Oxycodone-acetaminophen) .Marland Kitchen... Prn  Complete Medication List: 1)  [facility]ips Colon Health Caps (Probiotic product) .... Two times a day 2)  Protonix 40 Mg Tbec (Pantoprazole sodium) .... Qd 3)  Percocet 10-325 Mg Tabs (Oxycodone-acetaminophen)  .Marland KitchenMarland KitchenMarland Kitchen  Prn 4)  Diazepam 2 Mg Tabs (Diazepam) .... One daily at bedtime  Patient Instructions: 1)  Please schedule a follow-up appointment in 1 month if unimproved; 2)  take Alavert or Allegra once daily Prescriptions: DIAZEPAM 2 MG TABS (DIAZEPAM) one daily at bedtime  #30 x 0   Entered and Authorized by:   Gordy Savers  MD   Signed by:   Gordy Savers  MD on 03/28/2010   Method used:   Print then Give to Patient   RxID:   [id number]

## 2010-08-01 NOTE — Letter (Signed)
Summary: [facility] Instructions  [facility]  [address] [place], [place] [address]   Phone: [phone]  Fax: [phone]       Becky Moore    24-May-1983    MRN: [id number]        Procedure Day /Date: Monday, 08/29/09     Arrival Time: 1:30      Procedure Time: 2:30     Location of Procedure:                    [facility]  [facility] (4th Floor)                        PREPARATION FOR COLONOSCOPY WITH MOVIPREP   Starting 5 days prior to your procedure 08/24/09 do not eat nuts, seeds, popcorn, corn, beans, peas,  salads, or any raw vegetables.  Do not take any fiber supplements (e.g. Metamucil, Citrucel, and Benefiber).  THE DAY BEFORE YOUR PROCEDURE         DATE: 08/28/09   DAY: Sunday  1.  Drink clear liquids the entire day-NO SOLID FOOD  2.  Do not drink anything colored red or purple.  Avoid juices with pulp.  No orange juice.  3.  Drink at least 64 oz. (8 glasses) of fluid/clear liquids during the day to prevent dehydration and help the prep work efficiently.  CLEAR LIQUIDS INCLUDE: Water Jello Ice Popsicles Tea (sugar ok, no milk/cream) Powdered fruit flavored drinks Coffee (sugar ok, no milk/cream) Gatorade Juice: apple, white grape, white cranberry  Lemonade Clear bullion, consomm, broth Carbonated beverages (any kind) Strained chicken noodle soup Hard Candy                             4.  In the morning, mix first dose of MoviPrep solution:    Empty 1 Pouch A and 1 Pouch B into the disposable container    Add lukewarm drinking water to the top line of the container. Mix to dissolve    Refrigerate (mixed solution should be used within 24 hrs)  5.  Begin drinking the prep at 5:00 p.m. The MoviPrep container is divided by 4 marks.   Every 15 minutes drink the solution down to the next mark (approximately 8 oz) until the full liter is complete.   6.  Follow completed prep with 16 oz of clear liquid of your choice (Nothing red  or purple).  Continue to drink clear liquids until bedtime.  7.  Before going to bed, mix second dose of MoviPrep solution:    Empty 1 Pouch A and 1 Pouch B into the disposable container    Add lukewarm drinking water to the top line of the container. Mix to dissolve    Refrigerate  THE DAY OF YOUR PROCEDURE      DATE: 08/29/09  DAY: Monday  Beginning at 9:30a.m. (5 hours before procedure):         1. Every 15 minutes, drink the solution down to the next mark (approx 8 oz) until the full liter is complete.  2. Follow completed prep with 16 oz. of clear liquid of your choice.    3. You may drink clear liquids until 12:30  (2 HOURS BEFORE PROCEDURE).   MEDICATION INSTRUCTIONS  Unless otherwise instructed, you should take regular prescription medications with a small sip of water   as early as possible the morning of  your procedure.                 OTHER INSTRUCTIONS  You will need a responsible adult at least 42 years of age to accompany you and drive you home.   This person must remain in the waiting room during your procedure.  Wear loose fitting clothing that is easily removed.  Leave jewelry and other valuables at home.  However, you may wish to bring a book to read or  an iPod/MP3 player to listen to music as you wait for your procedure to start.  Remove all body piercing jewelry and leave at home.  Total time from sign-in until discharge is approximately 2-3 hours.  You should go home directly after your procedure and rest.  You can resume normal activities the  day after your procedure.  The day of your procedure you should not:   Drive   Make legal decisions   Operate machinery   Drink alcohol   Return to work  You will receive specific instructions about eating, activities and medications before you leave.    The above instructions have been reviewed and explained to me by   _______________________    I fully understand and can  verbalize these instructions _____________________________ Date _________

## 2010-08-01 NOTE — Assessment & Plan Note (Signed)
Summary: dizziness/unsteady/nausea/dm   Vital Signs:  Patient profile:   40 year old female Weight:      181 pounds Temp:     97.8 degrees F oral BP sitting:   120 / 80  (right arm) Cuff size:   regular  Vitals Entered By: Duard Brady LPN (December 30, 1912 2:35 PM) CC: c/o dizziness , nausea  Is Patient Diabetic? No   CC:  c/o dizziness  and nausea .  History of Present Illness: 40 year old patient who is until approximately 3 a.m. today when she awoke with the fairly classic vertigo.  she describes the dizziness and a spinning sensation, and a sense of being off balance.  The symptoms are associated with nausea.  Denies any focal neurological symptoms.  There has been no headache, double vision, speech or swallowing difficulties.  Allergies (verified): No Known Drug Allergies  Review of Systems       The patient complains of difficulty walking.  The patient denies anorexia, fever, weight loss, weight gain, vision loss, decreased hearing, hoarseness, chest pain, syncope, dyspnea on exertion, peripheral edema, prolonged cough, headaches, hemoptysis, abdominal pain, melena, hematochezia, severe indigestion/heartburn, hematuria, incontinence, genital sores, muscle weakness, suspicious skin lesions, transient blindness, depression, unusual weight change, abnormal bleeding, enlarged lymph nodes, angioedema, breast masses, and testicular masses.    Physical Exam  General:  Well-developed,well-nourished,in no acute distress; alert,appropriate and cooperative throughout examination Head:  Normocephalic and atraumatic without obvious abnormalities. No apparent alopecia or balding. Eyes:  No corneal or conjunctival inflammation noted. EOMI. Perrla. Funduscopic exam benign, without hemorrhages, exudates or papilledema. Vision grossly normal. Ears:  External ear exam shows no significant lesions or deformities.  Otoscopic examination reveals clear canals, tympanic membranes are intact  bilaterally without bulging, retraction, inflammation or discharge. Hearing is grossly normal bilaterally. Nose:  External nasal examination shows no deformity or inflammation. Nasal mucosa are pink and moist without lesions or exudates. Mouth:  Oral mucosa and oropharynx without lesions or exudates.  Teeth in good repair. Neck:  No deformities, masses, or tenderness noted. Neurologic:  alert & oriented X3, sensation intact to light touch, gait normal, and finger-to-nose normal.  alert & oriented X3, sensation intact to light touch, gait normal, and finger-to-nose normal.     Impression & Recommendations:  Problem # 1:  VERTIGO (ICD-780.4) probably benign paroxysmal positional vertigo.  Will treat with oral antihistamines, as well as, Phenergan, IM and clinically observe  Complete Medication List: 1)  Phillips Colon Health Caps (Probiotic product) .... Two times a day 2)  Protonix 40 Mg Tbec (Pantoprazole sodium) .... Qd 3)  Percocet 10-325 Mg Tabs (Oxycodone-acetaminophen) .... Prn  Patient Instructions: 1)  using over-the-counter antihistamine such as Alavert Claritin or Zyrtec  once daily 2)  Nasonex use twice daily

## 2010-08-03 NOTE — Assessment & Plan Note (Signed)
Summary: CONSULT RE: DIABETES/CHOLESTEROL AND THYROID/PT COMING IN FAS...   Vital Signs:  Patient profile:   40 year old female Weight:      179 pounds O2 Sat:      97 % on Room air Temp:     98.1 degrees F Pulse rate:   80 / minute BP sitting:   116 / 80  (left arm) Cuff size:   regular  Vitals Entered By: Duard Brady LPN (July 25, 2010 8:04 AM)  O2 Flow:  Room air CC: wants labs draw for cholestrol, blood sugar and thyriod Is Patient Diabetic? No CBG Result 94   CC:  wants labs draw for cholestrol and blood sugar and thyriod.  History of Present Illness: 40 year old patient who is in today for follow-up.  She is a recent gynecologic examination and basically wanted to have some fasting lab and a general medical checkup.  She has done quite well.  She discontinued tobacco use, approximately 1 year ago.  Unfortunately, there is been no weight gain.  Last year.  She had a laparoscopic [employer] esophageal hernia repair and has done quite well.  No new concerns or complaints.  Fasting blood sugar today night for  Allergies (verified): No Known Drug Allergies  Past History:  Past Medical History: Reviewed history from 08/15/2009 and no changes required. G2 P1 abortion x 1 cervical dysplasia  GERD Depression  Past Surgical History: Cholecystectomy  11-2009 EGD 2011 colonoscopy 2011 laparoscopic mucin, esophageal repair June 2011  Family History: Reviewed history from 08/15/2009 and no changes required. DM - F, M, sister, niece, M. aunt CAD - niece, father decreased 83 of MI stroke - niece HTN - F, niece, sister breast Ca - no colon Ca -no  Father- age late 24s s/p CABG, Dyslipidemia Mother died age 64- DM complications 3    brothers-  4 sisters- DM (3)   Social History: Reviewed history from 08/18/2009 and no changes required. 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  Alcohol Use - no Daily Caffeine  Use 2-3 Illicit Drug Use - no  Review of Systems  The patient denies anorexia, fever, weight loss, weight gain, vision loss, decreased hearing, hoarseness, chest pain, syncope, dyspnea on exertion, peripheral edema, prolonged cough, headaches, hemoptysis, abdominal pain, melena, hematochezia, severe indigestion/heartburn, hematuria, incontinence, genital sores, muscle weakness, suspicious skin lesions, transient blindness, difficulty walking, depression, unusual weight change, abnormal bleeding, enlarged lymph nodes, angioedema, and breast masses.    Physical Exam  General:  overweight-appearing.  110/70overweight-appearing.   Head:  Normocephalic and atraumatic without obvious abnormalities. No apparent alopecia or balding. Eyes:  No corneal or conjunctival inflammation noted. EOMI. Perrla. Funduscopic exam benign, without hemorrhages, exudates or papilledema. Vision grossly normal. Ears:  External ear exam shows no significant lesions or deformities.  Otoscopic examination reveals clear canals, tympanic membranes are intact bilaterally without bulging, retraction, inflammation or discharge. Hearing is grossly normal bilaterally. Mouth:  Oral mucosa and oropharynx without lesions or exudates.  Teeth in good repair. Neck:  No deformities, masses, or tenderness noted. Lungs:  Normal respiratory effort, chest expands symmetrically. Lungs are clear to auscultation, no crackles or wheezes. Heart:  Normal rate and regular rhythm. S1 and S2 normal without gallop, murmur, click, rub or other extra sounds. Msk:  No deformity or scoliosis noted of thoracic or lumbar spine.   Pulses:  R and L carotid,radial,femoral,dorsalis pedis and posterior tibial pulses are full and equal bilaterally Extremities:  No clubbing, cyanosis, edema, or deformity noted with normal full range of motion of all joints.   Neurologic:  No cranial nerve deficits noted. Station and gait are normal. Plantar reflexes are down-going  bilaterally. DTRs are symmetrical throughout. Sensory, motor and coordinative functions appear intact. Skin:  Intact without suspicious lesions or rashes Cervical Nodes:  No lymphadenopathy noted Axillary Nodes:  No palpable lymphadenopathy Inguinal Nodes:  No significant adenopathy Psych:  Cognition and judgment appear intact. Alert and cooperative with normal attention span and concentration. No apparent delusions, illusions, hallucinations   Impression & Recommendations:  Problem # 1:  Preventive Health Care (ICD-V70.0)  Other Orders: Capillary Blood Glucose/CBG (36644) Venipuncture (03474) TLB-Lipid Panel (80061-LIPID) TLB-A1C / Hgb A1C (Glycohemoglobin) (83036-A1C) Specimen Handling (25956)  Patient Instructions: 1)  Please schedule a follow-up appointment in 1 year. 2)  Limit your Sodium (Salt). 3)  It is important that you exercise regularly at least 20 minutes 5 times a week. If you develop chest pain, have severe difficulty breathing, or feel very tired , stop exercising immediately and seek medical attention. 4)  You need to lose weight. Consider a lower calorie diet and regular exercise.    Orders Added: 1)  Capillary Blood Glucose/CBG [82948] 2)  Est. Patient 40-64 years [99396] 3)  Venipuncture [36415] 4)  TLB-Lipid Panel [80061-LIPID] 5)  TLB-A1C / Hgb A1C (Glycohemoglobin) [83036-A1C] 6)  Specimen Handling [99000]

## 2010-08-03 NOTE — Progress Notes (Signed)
Summary: Pt req to get lab results  Phone Note Call from Patient Call back at Home Phone [phone]   Caller: Patient Summary of Call: Pt called req lab results. Pls call back asap.  Initial call taken by: Lucy Antigua,  July 27, 2010 2:48 PM  Follow-up for Phone Call        spoke with pt - labs great - mailed copy to pt per request   KIK Follow-up by: Duard Brady LPN,  July 27, 2010 3:12 PM

## 2010-09-18 LAB — CBC
HCT: 37 % (ref 36.0–46.0)
Hemoglobin: 12.6 g/dL (ref 12.0–15.0)
MCHC: 34 g/dL (ref 30.0–36.0)
MCV: 92.9 fL (ref 78.0–100.0)
Platelets: 237 10*3/uL (ref 150–400)
RBC: 3.98 MIL/uL (ref 3.87–5.11)
RDW: 13 % (ref 11.5–15.5)
WBC: 15.3 10*3/uL — ABNORMAL HIGH (ref 4.0–10.5)

## 2010-09-18 LAB — SURGICAL PCR SCREEN
MRSA, PCR: NEGATIVE
Staphylococcus aureus: NEGATIVE

## 2010-09-18 LAB — PREGNANCY, URINE
Preg Test, Ur: NEGATIVE
Preg Test, Ur: NEGATIVE

## 2010-09-18 LAB — DIFFERENTIAL
Basophils Absolute: 0 10*3/uL (ref 0.0–0.1)
Basophils Relative: 0 % (ref 0–1)
Eosinophils Absolute: 0 10*3/uL (ref 0.0–0.7)
Eosinophils Relative: 0 % (ref 0–5)
Lymphocytes Relative: 12 % (ref 12–46)
Lymphs Abs: 1.9 10*3/uL (ref 0.7–4.0)
Monocytes Absolute: 1.3 10*3/uL — ABNORMAL HIGH (ref 0.1–1.0)
Monocytes Relative: 8 % (ref 3–12)
Neutro Abs: 12.1 10*3/uL — ABNORMAL HIGH (ref 1.7–7.7)
Neutrophils Relative %: 79 % — ABNORMAL HIGH (ref 43–77)

## 2010-10-04 LAB — POCT URINALYSIS DIP (DEVICE)
Bilirubin Urine: NEGATIVE
Glucose, UA: NEGATIVE mg/dL
Nitrite: NEGATIVE
Protein, ur: NEGATIVE mg/dL
Specific Gravity, Urine: 1.015 (ref 1.005–1.030)
Urobilinogen, UA: 1 mg/dL (ref 0.0–1.0)
pH: 7 (ref 5.0–8.0)

## 2010-10-04 LAB — URINE CULTURE
Colony Count: NO GROWTH
Culture: NO GROWTH

## 2010-10-04 LAB — POCT PREGNANCY, URINE: Preg Test, Ur: NEGATIVE

## 2010-11-17 NOTE — Op Note (Signed)
NAME:  Becky Moore, JUNCO                        ACCOUNT NO.:  [id number]   MEDICAL RECORD NO.:  [id number]                   PATIENT TYPE:  AMB   LOCATION:  DAY                                  FACILITY:  [facility]   PHYSICIAN:  Currie Paris, M.D.           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   OPERATION:  Laparoscopic cholecystectomy, lysis of adhesion.   SURGEON:  Currie Paris, M.D.   ASSISTANT:  Sheppard Plumber. Earlene Plater, M.D.   ANESTHESIA:  General endotracheal.   HISTORY:  This patient has had biliary-type symptoms and the finding of  gallstones on ultrasound.  She was scheduled for cholecystectomy.   DESCRIPTION OF PROCEDURE:  The patient was seen in the holding area and had  no further questions.  She was taken to the operating room and after  satisfactory general anesthesia the abdomen was prepped and draped.  0.25%  plain Marcaine was used for each incision.   An umbilical incision was made, the fascia opened and the peritoneal cavity  entered under direct vision.  A purse-string was placed, the Hasson was  introduced and the abdomen insufflated to 15.  The patient was placed in  reverse Trendelenburg and a 10-11 trocar placed in the epigastrium and two  5's laterally.  There were multiple adhesions of the liver to the anterior  abdominal wall which were taken down with the scissors and cautery.  This  freed up the liver nicely.  The gallbladder was somewhat contracted and a  little chronically scarred.  It was retracted over the liver and the  peritoneum on either side of the triangle of Calot opened until I could get  a window dissected here and see the cystic artery and dissect it free from  the  cystic duct where it was fairly densely adherent.  The cystic duct  appeared somewhat short but I was able to get a nice window to make sure I  had the anatomy correct and see the cystic and common duct junction.  Two  clips were placed on the cystic artery and one on the cystic duct at its  junction with the gallbladder and the cystic duct was opened.  Intraoperative cholangiography was done and showed good filling of the  common duct, hepatic radicals and duodenum with no filling defects.   The clip was removed from the cystic duct and the catheter pulled out.  Three clips were placed on the stay side of the cystic duct and it was  divided.  An additional clip was placed on the artery and it was divided  with two clips on the stay side.  The gallbladder was removed from below to  above.  A posterior artery was found running along the bed of the  gallbladder and several clips were placed in a row to get this well-  controlled.  Once that was done the gallbladder was removed with further  difficulty.  It was placed in a bag and brought out through the umbilical  port.  We copiously irrigated and made a final check for hemostasis and made  sure everything was dry, everything looked okay.  Once that was done the lateral ports were removed.  The umbilical port was  removed and the purse-string tied down.  The skin was closed with 4-0  Monocryl subcuticular and Dermabond.  Throughout the patient tolerated the  procedure well.  There were no intraoperative complications and all counts  were correct.                                               Currie Paris, M.D.    CJS/MEDQ  D:  11/15/2003  T:  11/15/2003  Job:  [id number]   cc:   Wanda Plump, MD [facility]  [phone] [place], [place] [address]   Vania Rea. Jarold Motto, M.D. [facility]

## 2012-07-10 ENCOUNTER — Encounter: Payer: Self-pay | Admitting: Gastroenterology

## 2013-04-08 ENCOUNTER — Emergency Department (HOSPITAL_COMMUNITY)
Admission: EM | Admit: 2013-04-08 | Discharge: 2013-04-08 | Disposition: A | Discharge status: Home / Self Care | Payer: Self-pay | Attending: Emergency Medicine | Admitting: Emergency Medicine

## 2013-04-08 ENCOUNTER — Encounter (HOSPITAL_COMMUNITY): Payer: Self-pay | Admitting: Emergency Medicine

## 2013-04-08 DIAGNOSIS — R059 Cough, unspecified: Secondary | ICD-10-CM | POA: Insufficient documentation

## 2013-04-08 DIAGNOSIS — Z792 Long term (current) use of antibiotics: Secondary | ICD-10-CM | POA: Insufficient documentation

## 2013-04-08 DIAGNOSIS — Z79899 Other long term (current) drug therapy: Secondary | ICD-10-CM | POA: Insufficient documentation

## 2013-04-08 DIAGNOSIS — J3489 Other specified disorders of nose and nasal sinuses: Secondary | ICD-10-CM | POA: Insufficient documentation

## 2013-04-08 DIAGNOSIS — N390 Urinary tract infection, site not specified: Secondary | ICD-10-CM | POA: Diagnosis present

## 2013-04-08 DIAGNOSIS — R05 Cough: Secondary | ICD-10-CM | POA: Insufficient documentation

## 2013-04-08 DIAGNOSIS — F17200 Nicotine dependence, unspecified, uncomplicated: Secondary | ICD-10-CM | POA: Insufficient documentation

## 2013-04-08 DIAGNOSIS — R109 Unspecified abdominal pain: Secondary | ICD-10-CM | POA: Diagnosis present

## 2013-04-08 DIAGNOSIS — R6883 Chills (without fever): Secondary | ICD-10-CM | POA: Insufficient documentation

## 2013-04-08 DIAGNOSIS — Z3202 Encounter for pregnancy test, result negative: Secondary | ICD-10-CM | POA: Insufficient documentation

## 2013-04-08 LAB — CBC WITH DIFFERENTIAL/PLATELET
Basophils Absolute: 0 10*3/uL (ref 0.0–0.1)
Hemoglobin: 13.6 g/dL (ref 12.0–15.0)
Lymphocytes Relative: 23 % (ref 12–46)
Lymphs Abs: 2.5 10*3/uL (ref 0.7–4.0)
MCHC: 33.1 g/dL (ref 30.0–36.0)
MCV: 93.8 fL (ref 78.0–100.0)
Monocytes Relative: 9 % (ref 3–12)
Neutrophils Relative %: 67 % (ref 43–77)
Platelets: 251 10*3/uL (ref 150–400)
RBC: 4.38 MIL/uL (ref 3.87–5.11)
WBC: 10.8 10*3/uL — ABNORMAL HIGH (ref 4.0–10.5)

## 2013-04-08 LAB — URINALYSIS W MICROSCOPIC + REFLEX CULTURE
Bilirubin Urine: NEGATIVE
Ketones, ur: NEGATIVE mg/dL
Nitrite: POSITIVE — AB
Protein, ur: NEGATIVE mg/dL
Specific Gravity, Urine: 1.01 (ref 1.005–1.030)
Urobilinogen, UA: 0.2 mg/dL (ref 0.0–1.0)
pH: 7 (ref 5.0–8.0)

## 2013-04-08 LAB — COMPREHENSIVE METABOLIC PANEL
ALT: 19 U/L (ref 0–35)
AST: 26 U/L (ref 0–37)
Alkaline Phosphatase: 79 U/L (ref 39–117)
BUN: 5 mg/dL — ABNORMAL LOW (ref 6–23)
CO2: 28 mEq/L (ref 19–32)
GFR calc Af Amer: >90 mL/min (ref >90)
GFR calc non Af Amer: 79 mL/min — ABNORMAL LOW (ref >90)
Glucose, Bld: 93 mg/dL (ref 70–99)
Potassium: 3.8 mEq/L (ref 3.5–5.1)
Sodium: 137 mEq/L (ref 135–145)
Total Bilirubin: 0.3 mg/dL (ref 0.3–1.2)
Total Protein: 7.3 g/dL (ref 6.0–8.3)

## 2013-04-08 MED ORDER — FLUCONAZOLE 150 MG PO TABS
150.0000 mg | ORAL_TABLET | Freq: Once | ORAL | Status: AC
  Administered 2013-04-08: 150 mg via ORAL
  Filled 2013-04-08: qty 1

## 2013-04-08 MED ORDER — CIPROFLOXACIN HCL 500 MG PO TABS
500.0000 mg | ORAL_TABLET | Freq: Once | ORAL | Status: AC
  Administered 2013-04-08: 500 mg via ORAL
  Filled 2013-04-08: qty 1

## 2013-04-08 MED ORDER — OXYCODONE-ACETAMINOPHEN 5-325 MG PO TABS: 1.0000 | ORAL_TABLET | Freq: Four times a day (QID) | ORAL | Status: DC | PRN

## 2013-04-08 MED ORDER — CIPROFLOXACIN HCL 500 MG PO TABS: 500.0000 mg | ORAL_TABLET | Freq: Two times a day (BID) | ORAL | Status: DC

## 2013-04-08 MED ORDER — OXYCODONE-ACETAMINOPHEN 5-325 MG PO TABS
1.0000 | ORAL_TABLET | Freq: Once | ORAL | Status: AC
  Administered 2013-04-08: 1 via ORAL
  Filled 2013-04-08: qty 1

## 2013-04-08 NOTE — ED Notes (Addendum)
Pt c/o RLQ abdominal pain, dysuria, cough and nasal congestion x 5-6 days.  Pain score 4/10, increases to 10/10.

## 2013-04-08 NOTE — ED Provider Notes (Signed)
CSN: [id number]     Arrival date & time 04/08/13  1509 History   First MD Initiated Contact with Patient 04/08/13 1526     Chief Complaint  Patient presents with  . Abdominal Pain  . Cough  . Nasal Congestion   (Consider location/radiation/quality/duration/timing/severity/associated sxs/prior Treatment) Patient is a 42 y.o. female presenting with abdominal pain and cough. The history is provided by the patient.  Abdominal Pain Pain location:  RLQ Pain quality: sharp   Pain radiates to:  Does not radiate Pain severity now: mild to moderate. Onset quality:  Gradual Duration:  5 days Timing:  Intermittent Progression:  Improving Chronicity:  New Relieved by:  OTC medications Worsened by:  Nothing tried Ineffective treatments:  None tried Associated symptoms: chills, cough and dysuria   Associated symptoms: no chest pain, no diarrhea, no fatigue, no fever, no hematuria, no nausea, no shortness of breath and no vomiting   Cough Associated symptoms: chills   Associated symptoms: no chest pain, no fever, no headaches and no shortness of breath     History reviewed. No pertinent past medical history. Past Surgical History  Procedure Laterality Date  . Cholecystectomy    . Hernia repair     History reviewed. No pertinent family history. History  Substance Use Topics  . Smoking status: Current Every Day Smoker -- 0.25 packs/day    Types: Cigarettes  . Smokeless tobacco: Never Used  . Alcohol Use: Yes     Comment: occ   OB History   Grav Para Term Preterm Abortions TAB SAB Ect Mult Living                 Review of Systems  Constitutional: Positive for chills. Negative for fever and fatigue.  HENT: Positive for congestion. Negative for drooling.   Eyes: Negative for pain.  Respiratory: Positive for cough. Negative for shortness of breath.   Cardiovascular: Negative for chest pain.  Gastrointestinal: Positive for abdominal pain. Negative for nausea, vomiting and diarrhea.   Genitourinary: Positive for dysuria. Negative for hematuria.  Musculoskeletal: Negative for back pain, gait problem and neck pain.  Skin: Negative for color change.  Neurological: Negative for dizziness and headaches.  Hematological: Negative for adenopathy.  Psychiatric/Behavioral: Negative for behavioral problems.  All other systems reviewed and are negative.    Allergies  Review of patient's allergies indicates no known allergies.  Home Medications   Current Outpatient Rx  Name  Route  Sig  Dispense  Refill  . naproxen sodium (ANAPROX) 220 MG tablet   Oral   Take 440 mg by mouth 2 (two) times daily with a meal.         . penicillin v potassium (VEETID) 500 MG tablet   Oral   Take 250 mg by mouth 4 (four) times daily.          BP 134/85  Pulse 72  Temp(Src) 98.1 F (36.7 C) (Oral)  Resp 20  SpO2 98%  LMP 03/25/2013 Physical Exam  Nursing note and vitals reviewed. Constitutional: She is oriented to person, place, and time. She appears well-developed and well-nourished.  HENT:  Head: Normocephalic.  Mouth/Throat: Oropharynx is clear and moist. No oropharyngeal exudate.  Eyes: Conjunctivae and EOM are normal. Pupils are equal, round, and reactive to light.  Neck: Normal range of motion. Neck supple.  Cardiovascular: Normal rate, regular rhythm, normal heart sounds and intact distal pulses.  Exam reveals no gallop and no friction rub.   No murmur heard. Pulmonary/Chest: Effort normal  and breath sounds normal. No respiratory distress. She has no wheezes.  Abdominal: Soft. Bowel sounds are normal. There is tenderness (very mild ttp of RLQ w/ deep palpation, no rebound/guarding). There is no rebound and no guarding.  Musculoskeletal: Normal range of motion. She exhibits no edema and no tenderness.  Neurological: She is alert and oriented to person, place, and time.  Skin: Skin is warm and dry.  Psychiatric: She has a normal mood and affect. Her behavior is normal.     ED Course  Procedures (including critical care time) Labs Review Labs Reviewed  CBC WITH DIFFERENTIAL - Abnormal; Notable for the following:    WBC 10.8 (*)    All other components within normal limits  COMPREHENSIVE METABOLIC PANEL - Abnormal; Notable for the following:    BUN 5 (*)    Albumin 3.4 (*)    GFR calc non Af Amer 79 (*)    All other components within normal limits  URINALYSIS W MICROSCOPIC + REFLEX CULTURE - Abnormal; Notable for the following:    APPearance CLOUDY (*)    Hgb urine dipstick MODERATE (*)    Nitrite POSITIVE (*)    Leukocytes, UA LARGE (*)    Bacteria, UA MANY (*)    All other components within normal limits  URINE CULTURE   Imaging Review No results found.  MDM   1. UTI (lower urinary tract infection)   2. Abdominal pain    3:38 PM 42 y.o. female who presents with gradual onset intermittent right lower quadrant pain for 5-6 days. Patient also endorses mild congestion and dry cough. She denies any fevers. She states that she has a good appetite. She denies any nausea, vomiting, or diarrhea. She is afebrile and vital signs are unremarkable here. She rates her pain as a 4/10 and her abdomen is soft on exam with only minimal tenderness to palpation. She states that her symptoms feel like previous UTIs. I have a low suspicion for appendicitis given pain improving over the last few days instead of worsening, no vomiting, good appetite, no fever, and benign abdomen. However, will get screening labwork, urinalysis, and Percocet for pain.  5:08 PM: Mildly elev wbc noted, but also definite UTI. I informed the patient to followup with her doctor in one to 2 days and return for any worsening including fever, vomiting, or continued abdominal pain. I reexamined her abdomen which remains benign on exam. She requested treatment for vaginal candidiasis as she had been self-medicating with penicillin at home. I will also give her a small amount pain medicine for home.  I have discussed the diagnosis/risks/treatment options with the patient and believe the pt to be eligible for discharge home to follow-up with pcp in 1-2 days. We also discussed returning to the ED immediately if new or worsening sx occur. We discussed the sx which are most concerning (e.g., worsening sx) that necessitate immediate return. Any new prescriptions provided to the patient are listed below.  New Prescriptions   CIPROFLOXACIN (CIPRO) 500 MG TABLET    Take 1 tablet (500 mg total) by mouth every 12 (twelve) hours.   OXYCODONE-ACETAMINOPHEN (PERCOCET) 5-325 MG PER TABLET    Take 1 tablet by mouth every 6 (six) hours as needed for pain.     Junius Argyle, MD 04/08/13 [phone]

## 2013-04-10 LAB — URINE CULTURE: Colony Count: >=100000

## 2013-04-11 ENCOUNTER — Telehealth (HOSPITAL_COMMUNITY): Payer: Self-pay | Admitting: Emergency Medicine

## 2013-04-11 NOTE — ED Notes (Signed)
Post ED Visit - Positive Culture Follow-up  Culture report reviewed by antimicrobial stewardship pharmacist: []  Wes Dulaney, Pharm.D., BCPS [x]  Celedonio Miyamoto, Pharm.D., BCPS []  Georgina Pillion, Pharm.D., BCPS []  [place], [address].D., BCPS, AAHIVP []  Estella Husk, Pharm.D., BCPS, AAHIVP  Positive urine culture Treated with Cipro, organism sensitive to the same and no further patient follow-up is required at this time.  Kylie A Holland 04/11/2013, 3:15 PM

## 2013-04-17 ENCOUNTER — Encounter: Payer: Self-pay | Admitting: Gastroenterology

## 2014-03-06 ENCOUNTER — Encounter: Payer: Self-pay | Admitting: Gastroenterology

## 2014-08-20 ENCOUNTER — Emergency Department (HOSPITAL_COMMUNITY)
Admission: EM | Admit: 2014-08-20 | Discharge: 2014-08-20 | Disposition: A | Discharge status: Home / Self Care | Payer: Worker's Compensation | Attending: Emergency Medicine | Admitting: Emergency Medicine

## 2014-08-20 ENCOUNTER — Emergency Department (HOSPITAL_COMMUNITY): Payer: Worker's Compensation

## 2014-08-20 DIAGNOSIS — Z791 Long term (current) use of non-steroidal anti-inflammatories (NSAID): Secondary | ICD-10-CM | POA: Insufficient documentation

## 2014-08-20 DIAGNOSIS — Z23 Encounter for immunization: Secondary | ICD-10-CM | POA: Insufficient documentation

## 2014-08-20 DIAGNOSIS — Y9289 Other specified places as the place of occurrence of the external cause: Secondary | ICD-10-CM | POA: Diagnosis not present

## 2014-08-20 DIAGNOSIS — Z792 Long term (current) use of antibiotics: Secondary | ICD-10-CM | POA: Diagnosis not present

## 2014-08-20 DIAGNOSIS — S99912A Unspecified injury of left ankle, initial encounter: Secondary | ICD-10-CM | POA: Diagnosis present

## 2014-08-20 DIAGNOSIS — Y9389 Activity, other specified: Secondary | ICD-10-CM | POA: Diagnosis not present

## 2014-08-20 DIAGNOSIS — Z720 Tobacco use: Secondary | ICD-10-CM | POA: Diagnosis not present

## 2014-08-20 DIAGNOSIS — M25572 Pain in left ankle and joints of left foot: Secondary | ICD-10-CM

## 2014-08-20 DIAGNOSIS — Y990 Civilian activity done for income or pay: Secondary | ICD-10-CM | POA: Diagnosis not present

## 2014-08-20 DIAGNOSIS — Y288XXA Contact with other sharp object, undetermined intent, initial encounter: Secondary | ICD-10-CM | POA: Insufficient documentation

## 2014-08-20 DIAGNOSIS — S91012A Laceration without foreign body, left ankle, initial encounter: Secondary | ICD-10-CM | POA: Diagnosis not present

## 2014-08-20 DIAGNOSIS — T148XXA Other injury of unspecified body region, initial encounter: Secondary | ICD-10-CM

## 2014-08-20 MED ORDER — TETANUS-DIPHTH-ACELL PERTUSSIS 5-2.5-18.5 LF-MCG/0.5 IM SUSP
0.5000 mL | Freq: Once | INTRAMUSCULAR | Status: AC
  Administered 2014-08-20: 0.5 mL via INTRAMUSCULAR
  Filled 2014-08-20: qty 0.5

## 2014-08-20 MED ORDER — BACITRACIN ZINC 500 UNIT/GM EX OINT: 1.0000 "application " | TOPICAL_OINTMENT | Freq: Two times a day (BID) | CUTANEOUS | Status: DC

## 2014-08-20 MED ORDER — HYDROCODONE-ACETAMINOPHEN 5-325 MG PO TABS: 1.0000 | ORAL_TABLET | Freq: Four times a day (QID) | ORAL | Status: DC | PRN

## 2014-08-20 MED ORDER — BACITRACIN 500 UNIT/GM EX OINT
1.0000 "application " | TOPICAL_OINTMENT | Freq: Two times a day (BID) | CUTANEOUS | Status: DC
  Administered 2014-08-20: 1 via TOPICAL
  Filled 2014-08-20 (×2): qty 0.9

## 2014-08-20 NOTE — ED Provider Notes (Signed)
CSN: [id number]     Arrival date & time 08/20/14  2028 History   First MD Initiated Contact with Patient 08/20/14 2114     Chief Complaint  Patient presents with  . Leg Injury   The history is provided by the patient. No language interpreter was used.   This chart was scribed for non-physician practitioner Antonietta Breach, PA-C, working with Ernestina Patches, MD, by Thea Alken, ED Scribe. This patient was seen in room WTR9/WTR9 and the patient's care was started at 10:34 PM.  Becky Moore is a 44 y.o. female who presents to the Emergency Department complaining of left leg injury that occurred about 20 hours ago. Pt states she lacerated her lateral left ankle last night while at work from a sliding metal door. Pt states she thought she could handle the pain but has had pain with walking and while applying pressure. She has applied peroxide, neosporin and gauze to the wound. Pt is concerned of a possible infection. She denies fever, numbness/tingling, inability to ambulate, and purulent drainage from the wound. Tetanus status unknown.  No past medical history on file. Past Surgical History  Procedure Laterality Date  . Cholecystectomy    . Hernia repair     No family history on file. History  Substance Use Topics  . Smoking status: Current Every Day Smoker -- 0.25 packs/day    Types: Cigarettes  . Smokeless tobacco: Never Used  . Alcohol Use: Yes     Comment: occ   OB History    No data available      Review of Systems  Musculoskeletal: Positive for myalgias and joint swelling.  Skin: Positive for wound.  All other systems reviewed and are negative.   Allergies  Review of patient's allergies indicates no known allergies.  Home Medications   Prior to Admission medications   Medication Sig Start Date End Date Taking? Authorizing Provider  bacitracin ointment Apply 1 application topically 2 (two) times daily. 08/20/14   Antonietta Breach, PA-C  ciprofloxacin (CIPRO) 500 MG tablet  Take 1 tablet (500 mg total) by mouth every 12 (twelve) hours. 04/08/13   Pamella Pert, MD  HYDROcodone-acetaminophen (NORCO/VICODIN) 5-325 MG per tablet Take 1-2 tablets by mouth every 6 (six) hours as needed for moderate pain or severe pain. 08/20/14   Antonietta Breach, PA-C  naproxen sodium (ANAPROX) 220 MG tablet Take 440 mg by mouth 2 (two) times daily with a meal.    Historical Provider, MD  oxyCODONE-acetaminophen (PERCOCET) 5-325 MG per tablet Take 1 tablet by mouth every 6 (six) hours as needed for pain. 04/08/13   Pamella Pert, MD  penicillin v potassium (VEETID) 500 MG tablet Take 250 mg by mouth 4 (four) times daily.    Historical Provider, MD   BP 144/85 mmHg  Pulse 74  Temp(Src) 97.7 F (36.5 C) (Oral)  Resp 18  Ht 5' 4.5" (1.638 m)  Wt 191 lb (86.637 kg)  BMI 32.29 kg/m2  SpO2 100%  LMP 08/13/2014 (Exact Date)   Physical Exam  Constitutional: She is oriented to person, place, and time. She appears well-developed and well-nourished. No distress.  Nontoxic/nonseptic appearing  HENT:  Head: Normocephalic and atraumatic.  Eyes: Conjunctivae and EOM are normal. No scleral icterus.  Neck: Normal range of motion.  Cardiovascular: Normal rate, regular rhythm and intact distal pulses.   DP and PT pulses 2+ in LLE  Pulmonary/Chest: Effort normal. No respiratory distress.  Musculoskeletal: Normal range of motion.  Left ankle: She exhibits swelling and laceration. She exhibits normal range of motion and no deformity. Tenderness. Lateral malleolus tenderness found. Achilles tendon normal.       Feet:  Superficial laceration noted to the lateral left ankle. There is mild swelling about the lateral malleolus with associated tenderness to palpation. No crepitus or deformity. No effusion. Bleeding controlled. No associated induration, erythema, heat to touch, or purulent drainage from the wound noted on exam.  Neurological: She is alert and oriented to person, place, and time. She  exhibits normal muscle tone. Coordination normal.  Sensation to light touch intact  Skin: Skin is warm and dry. No rash noted. She is not diaphoretic. No erythema. No pallor.  Psychiatric: She has a normal mood and affect. Her behavior is normal.  Nursing note and vitals reviewed.   ED Course  Procedures (including critical care time) DIAGNOSTIC STUDIES: Oxygen Saturation is 100% on RA, normal  by my interpretation.    COORDINATION OF CARE: 10:34 PM- Pt advised of plan for treatment and pt agrees.  Labs Review Labs Reviewed - No data to display  Imaging Review Dg Ankle Complete Left  08/20/2014   CLINICAL DATA:  Patient reports she "gashed" her left leg open while at work last night. Lateral left ankle is currently covered with gauze. Unable to remove due to bleeding. Swelling noted around left lateral malleolus of left ankle.  EXAM: LEFT ANKLE COMPLETE - 3+ VIEW  COMPARISON:  None.  FINDINGS: No fracture. Ankle mortise normally spaced and aligned. No arthropathic change.  There is lateral soft tissue swelling. Soft tissue defect is noted laterally above the ankle joint level. There is no radiopaque foreign body.  IMPRESSION: No fracture dislocation.  No radiopaque foreign body.   Electronically Signed   By: Lajean Manes M.D.   On: 08/20/2014 21:59     EKG Interpretation None      MDM   Final diagnoses:  Ankle pain, left  Superficial laceration    44 year old nontoxic-appearing female presents to the emergency department for further evaluation of injury to her left ankle. Patient noted to have a superficial laceration which was sustained almost 24 hours prior to ED arrival. X-ray obtained for further evaluation of lateral malleolar tenderness and swelling. This is negative for fracture, dislocation, or bony deformity. No foreign bodies visualized.  Have discussed wound care with the patient as well as an inability to suture the wound due to the risk of infection. Supportive  treatments discussed with the patient. Will prescribe Norco for pain as needed as well as bacitracin to apply topically to prevent infection. Wound recheck with PCP advised in 3 days. Return precautions given. Patient agreeable to plan with no unaddressed concerns. Patient discharged in good condition.  I personally performed the services described in this documentation, which was scribed in my presence. The recorded information has been reviewed and is accurate.   Filed Vitals:   08/20/14 2039  BP: 144/85  Pulse: 74  Temp: 97.7 F (36.5 C)  TempSrc: Oral  Resp: 18  Height: 5' 4.5" (1.638 m)  Weight: 191 lb (86.637 kg)  SpO2: 100%     Antonietta Breach, PA-C 08/20/14 2239  Ernestina Patches, MD 08/20/14 2351

## 2014-08-20 NOTE — ED Notes (Addendum)
Patient reports she "gashed" her left leg open while at work last night.  Lateral area above left ankle is currently covered with gauze.  Minimal swelling noted below site of injury.

## 2014-08-20 NOTE — Discharge Instructions (Signed)
Keep the area clean and dry with soap and warm water. Apply bacitracin twice a day. Keep the area covered while healing. Follow-up with your primary care doctor for a recheck of symptoms in 3 days. Return to the emergency department as needed if symptoms worsen.  Laceration Care, Adult A laceration is a cut or lesion that goes through all layers of the skin and into the tissue just beneath the skin. TREATMENT  Some lacerations may not require closure. Some lacerations may not be able to be closed due to an increased risk of infection. It is important to see your caregiver as soon as possible after an injury to minimize the risk of infection and maximize the opportunity for successful closure. If closure is appropriate, pain medicines may be given, if needed. The wound will be cleaned to help prevent infection. Your caregiver will use stitches (sutures), staples, wound glue (adhesive), or skin adhesive strips to repair the laceration. These tools bring the skin edges together to allow for faster healing and a better cosmetic outcome. However, all wounds will heal with a scar. Once the wound has healed, scarring can be minimized by covering the wound with sunscreen during the day for 1 full year. HOME CARE INSTRUCTIONS  For sutures or staples:  Keep the wound clean and dry.  If you were given a bandage (dressing), you should change it at least once a day. Also, change the dressing if it becomes wet or dirty, or as directed by your caregiver.  Wash the wound with soap and water 2 times a day. Rinse the wound off with water to remove all soap. Pat the wound dry with a clean towel.  After cleaning, apply a thin layer of the antibiotic ointment as recommended by your caregiver. This will help prevent infection and keep the dressing from sticking.  You may shower as usual after the first 24 hours. Do not soak the wound in water until the sutures are removed.  Only take over-the-counter or prescription  medicines for pain, discomfort, or fever as directed by your caregiver.  Get your sutures or staples removed as directed by your caregiver. For skin adhesive strips:  Keep the wound clean and dry.  Do not get the skin adhesive strips wet. You may bathe carefully, using caution to keep the wound dry.  If the wound gets wet, pat it dry with a clean towel.  Skin adhesive strips will fall off on their own. You may trim the strips as the wound heals. Do not remove skin adhesive strips that are still stuck to the wound. They will fall off in time. For wound adhesive:  You may briefly wet your wound in the shower or bath. Do not soak or scrub the wound. Do not swim. Avoid periods of heavy perspiration until the skin adhesive has fallen off on its own. After showering or bathing, gently pat the wound dry with a clean towel.  Do not apply liquid medicine, cream medicine, or ointment medicine to your wound while the skin adhesive is in place. This may loosen the film before your wound is healed.  If a dressing is placed over the wound, be careful not to apply tape directly over the skin adhesive. This may cause the adhesive to be pulled off before the wound is healed.  Avoid prolonged exposure to sunlight or tanning lamps while the skin adhesive is in place. Exposure to ultraviolet light in the first year will darken the scar.  The skin adhesive will  usually remain in place for 5 to 10 days, then naturally fall off the skin. Do not pick at the adhesive film. You may need a tetanus shot if:  You cannot remember when you had your last tetanus shot.  You have never had a tetanus shot. If you get a tetanus shot, your arm may swell, get red, and feel warm to the touch. This is common and not a problem. If you need a tetanus shot and you choose not to have one, there is a rare chance of getting tetanus. Sickness from tetanus can be serious. SEEK MEDICAL CARE IF:   You have redness, swelling, or  increasing pain in the wound.  You see a red line that goes away from the wound.  You have yellowish-white fluid (pus) coming from the wound.  You have a fever.  You notice a bad smell coming from the wound or dressing.  Your wound breaks open before or after sutures have been removed.  You notice something coming out of the wound such as wood or glass.  Your wound is on your hand or foot and you cannot move a finger or toe. SEEK IMMEDIATE MEDICAL CARE IF:   Your pain is not controlled with prescribed medicine.  You have severe swelling around the wound causing pain and numbness or a change in color in your arm, hand, leg, or foot.  Your wound splits open and starts bleeding.  You have worsening numbness, weakness, or loss of function of any joint around or beyond the wound.  You develop painful lumps near the wound or on the skin anywhere on your body. MAKE SURE YOU:   Understand these instructions.  Will watch your condition.  Will get help right away if you are not doing well or get worse. Document Released: 06/18/2005 Document Revised: 09/10/2011 Document Reviewed: 12/12/2010 [facility] Patient Information 2015 [place], [place]. This information is not intended to replace advice given to you by your health care provider. Make sure you discuss any questions you have with your health care provider.

## 2014-08-21 NOTE — ED Provider Notes (Signed)
0010 - Patient called the ED requesting 5 days off work for her injury. I relayed to nurse who answered the patient's call that patient could have a work note for 1 day off work. Per nurse, patient was angered by this response and started requesting to be off work through Monday because of pain. I feel the patient's injuries do not warrant such extended restrictions. Patient's physical exam was consistent with a mild contusion to her lateral malleolus at her laceration site. She has no bony injuries that would require weight bearing restrictions. Of note, patient also requested change in Rx from Norco to [facility] upon discharge. She was notified that she could take NSAIDs in conjunction with the Norco prescribed to her if she needed any additional pain control.  Antonietta Breach, PA-C 08/21/14 [id number]  Ernestina Patches, MD 08/21/14 [phone]

## 2014-09-05 ENCOUNTER — Encounter (HOSPITAL_COMMUNITY): Payer: Self-pay | Admitting: Emergency Medicine

## 2014-09-05 ENCOUNTER — Emergency Department (HOSPITAL_COMMUNITY)
Admission: EM | Admit: 2014-09-05 | Discharge: 2014-09-06 | Disposition: A | Discharge status: Home / Self Care | Payer: Self-pay | Attending: Emergency Medicine | Admitting: Emergency Medicine

## 2014-09-05 DIAGNOSIS — R55 Syncope and collapse: Secondary | ICD-10-CM | POA: Insufficient documentation

## 2014-09-05 DIAGNOSIS — Z79899 Other long term (current) drug therapy: Secondary | ICD-10-CM | POA: Insufficient documentation

## 2014-09-05 DIAGNOSIS — R002 Palpitations: Secondary | ICD-10-CM | POA: Insufficient documentation

## 2014-09-05 DIAGNOSIS — Z792 Long term (current) use of antibiotics: Secondary | ICD-10-CM | POA: Insufficient documentation

## 2014-09-05 DIAGNOSIS — R4789 Other speech disturbances: Secondary | ICD-10-CM

## 2014-09-05 DIAGNOSIS — Z720 Tobacco use: Secondary | ICD-10-CM | POA: Insufficient documentation

## 2014-09-05 DIAGNOSIS — R480 Dyslexia and alexia: Secondary | ICD-10-CM | POA: Insufficient documentation

## 2014-09-05 DIAGNOSIS — Z791 Long term (current) use of non-steroidal anti-inflammatories (NSAID): Secondary | ICD-10-CM | POA: Insufficient documentation

## 2014-09-05 LAB — CBG MONITORING, ED: GLUCOSE-CAPILLARY: 113 mg/dL — AB (ref 70–99)

## 2014-09-05 NOTE — ED Provider Notes (Signed)
CSN: [id number]     Arrival date & time 09/05/14  2242 History  This chart was scribed for non-physician practitioner, Junius Creamer, FNP,working with Kalman Drape, MD, by Marlowe Kays, ED Scribe. This patient was seen in room WTR8/WTR8 and the patient's care was started at 11:27 PM.  Chief Complaint  Patient presents with  . Near Syncope   The history is provided by the patient and medical records. No language interpreter was used.    HPI Comments:  Becky Moore is a 44 y.o. female who presents to the Emergency Department complaining of a near syncopal episode that occurred approximately one hour ago. Pt states she bent down to pick something up and her right arm became weak and she "had no control over it". Pt states she began to feel clammy and felt as if she would pass out. She states the episode last about 10 seconds and she went down to her knees. She denies any modifying factors of the symptoms. Denies CP, SOB, dizziness, light-headedness, hemiparesis, numbness or HA. Denies h/o DM or cardiac history. Denies any unexpected weight loss or excessive thirst. Patient is currently on her menstrual cycle.   History reviewed. No pertinent past medical history. Past Surgical History  Procedure Laterality Date  . Cholecystectomy    . Hernia repair     No family history on file. History  Substance Use Topics  . Smoking status: Current Every Day Smoker -- 0.25 packs/day    Types: Cigarettes  . Smokeless tobacco: Never Used  . Alcohol Use: Yes     Comment: occ   OB History    No data available     Review of Systems  Constitutional: Negative for activity change and appetite change.  Respiratory: Negative for shortness of breath.   Cardiovascular: Positive for palpitations. Negative for chest pain.  Neurological: Positive for speech difficulty. Negative for dizziness, weakness, light-headedness, numbness and headaches.  All other systems reviewed and are negative.   Allergies   Review of patient's allergies indicates no known allergies.  Home Medications   Prior to Admission medications   Medication Sig Start Date End Date Taking? Authorizing Provider  cephALEXin (KEFLEX) 500 MG capsule Take 500 mg by mouth 3 (three) times daily.   Yes Historical Provider, MD  bacitracin ointment Apply 1 application topically 2 (two) times daily. Patient not taking: Reported on 09/05/2014 08/20/14   Antonietta Breach, PA-C  ciprofloxacin (CIPRO) 500 MG tablet Take 1 tablet (500 mg total) by mouth every 12 (twelve) hours. Patient not taking: Reported on 09/05/2014 04/08/13   Pamella Pert, MD  HYDROcodone-acetaminophen (NORCO/VICODIN) 5-325 MG per tablet Take 1-2 tablets by mouth every 6 (six) hours as needed for moderate pain or severe pain. Patient not taking: Reported on 09/05/2014 08/20/14   Antonietta Breach, PA-C  naproxen sodium (ANAPROX) 220 MG tablet Take 440 mg by mouth 2 (two) times daily with a meal.    Historical Provider, MD  oxyCODONE-acetaminophen (PERCOCET) 5-325 MG per tablet Take 1 tablet by mouth every 6 (six) hours as needed for pain. Patient not taking: Reported on 09/05/2014 04/08/13   Pamella Pert, MD  penicillin v potassium (VEETID) 500 MG tablet Take 250 mg by mouth 4 (four) times daily.    Historical Provider, MD   Triage Vitals: BP 112/73 mmHg  Pulse 74  Temp(Src) 98.3 F (36.8 C) (Oral)  Resp 14  SpO2 100%  LMP 09/03/2014 Physical Exam  Constitutional: She is oriented to person, place, and time. She  appears well-developed and well-nourished.  HENT:  Head: Normocephalic and atraumatic.  Eyes: EOM are normal.  Neck: Normal range of motion.  Cardiovascular: Normal rate.   Pulmonary/Chest: Effort normal.  Musculoskeletal: Normal range of motion.  Neurological: She is alert and oriented to person, place, and time.  Skin: Skin is warm and dry.  Psychiatric: She has a normal mood and affect. Her behavior is normal.  Nursing note and vitals reviewed.   ED Course   Procedures (including critical care time) DIAGNOSTIC STUDIES: Oxygen Saturation is 100% on RA, normal by my interpretation.   COORDINATION OF CARE: 11:33 PM- Will order EKG and CBG. Pt verbalizes understanding and agrees to plan.  Medications - No data to display  Labs Review Labs Reviewed  CBG MONITORING, ED - Abnormal; Notable for the following:    Glucose-Capillary 113 (*)    All other components within normal limits    Imaging Review Ct Head Wo Contrast  09/06/2014   CLINICAL DATA:  Word finding difficulty. Confusion. Right arm numbness.  EXAM: CT HEAD WITHOUT CONTRAST  TECHNIQUE: Contiguous axial images were obtained from the base of the skull through the vertex without intravenous contrast.  COMPARISON:  None.  FINDINGS: No intracranial hemorrhage, mass effect, or midline shift. No hydrocephalus. The basilar cisterns are patent. No evidence of territorial infarct. No intracranial fluid collection. Calvarium is intact. Mild mucosal thickening of the ethmoid air cells, no fluid levels in the paranasal sinuses. The mastoid air cells are well aerated.  IMPRESSION: 1.  No acute intracranial abnormality. 2. Mild ethmoid sinus disease.   Electronically Signed   By: Jeb Levering M.D.   On: 09/06/2014 02:27     EKG Interpretation None     Patient has normal glucose level, normal EKG, normal head CT.  She had a near syncopal episode.  She does have a primary care physician.  She is not orthostatic.  This has been discussed with the patient MDM   Final diagnoses:  Word finding difficulty  Near syncope       I personally performed the services described in this documentation, which was scribed in my presence. The recorded information has been reviewed and is accurate.    Garald Balding, NP 09/06/14 [place], MD 09/06/14 [phone]

## 2014-09-05 NOTE — ED Notes (Signed)
Pt reports that about an hour ago when she was bending down to pick something up, she had an episode where she felt like her shoulder "gave out on her," her heart was beating real fast, and she felt like she was going to pass out. Pt states sx lasted about 10 seconds and then went away. Denies N/V/D. Pt reports eating about an hour before the episode happened. Denies pain. Ambulatory with steady gait.

## 2014-09-06 ENCOUNTER — Emergency Department (HOSPITAL_COMMUNITY): Payer: Self-pay

## 2014-09-06 NOTE — Discharge Instructions (Signed)
Neither EKG is normal, your blood work shows that you do not have an elevated glucose level.  Your head CT is normal you're not orthostatic.  Please make an appointment with Dr. Burnice Logan for further evaluation.  Return immediately for further evaluation if you develop repeat symptoms

## 2014-09-06 NOTE — ED Notes (Signed)
Pt to CT

## 2014-09-09 ENCOUNTER — Encounter (HOSPITAL_BASED_OUTPATIENT_CLINIC_OR_DEPARTMENT_OTHER): Payer: Worker's Compensation | Attending: Internal Medicine

## 2014-09-09 DIAGNOSIS — F17200 Nicotine dependence, unspecified, uncomplicated: Secondary | ICD-10-CM | POA: Insufficient documentation

## 2014-09-09 DIAGNOSIS — W228XXA Striking against or struck by other objects, initial encounter: Secondary | ICD-10-CM | POA: Insufficient documentation

## 2014-09-09 DIAGNOSIS — L97321 Non-pressure chronic ulcer of left ankle limited to breakdown of skin: Secondary | ICD-10-CM | POA: Diagnosis present

## 2014-09-09 DIAGNOSIS — Z833 Family history of diabetes mellitus: Secondary | ICD-10-CM | POA: Insufficient documentation

## 2014-09-16 DIAGNOSIS — Z833 Family history of diabetes mellitus: Secondary | ICD-10-CM | POA: Diagnosis not present

## 2014-09-16 DIAGNOSIS — F17200 Nicotine dependence, unspecified, uncomplicated: Secondary | ICD-10-CM | POA: Diagnosis not present

## 2014-09-16 DIAGNOSIS — L97321 Non-pressure chronic ulcer of left ankle limited to breakdown of skin: Secondary | ICD-10-CM | POA: Diagnosis not present

## 2014-09-30 DIAGNOSIS — F17200 Nicotine dependence, unspecified, uncomplicated: Secondary | ICD-10-CM | POA: Diagnosis not present

## 2014-09-30 DIAGNOSIS — Z833 Family history of diabetes mellitus: Secondary | ICD-10-CM | POA: Diagnosis not present

## 2014-09-30 DIAGNOSIS — L97321 Non-pressure chronic ulcer of left ankle limited to breakdown of skin: Secondary | ICD-10-CM | POA: Diagnosis not present

## 2014-10-08 ENCOUNTER — Encounter (HOSPITAL_BASED_OUTPATIENT_CLINIC_OR_DEPARTMENT_OTHER): Payer: Worker's Compensation | Attending: Internal Medicine

## 2014-10-08 DIAGNOSIS — F17200 Nicotine dependence, unspecified, uncomplicated: Secondary | ICD-10-CM | POA: Diagnosis not present

## 2014-10-08 DIAGNOSIS — W228XXD Striking against or struck by other objects, subsequent encounter: Secondary | ICD-10-CM | POA: Diagnosis not present

## 2014-10-08 DIAGNOSIS — Z833 Family history of diabetes mellitus: Secondary | ICD-10-CM | POA: Insufficient documentation

## 2014-10-08 DIAGNOSIS — L97321 Non-pressure chronic ulcer of left ankle limited to breakdown of skin: Secondary | ICD-10-CM | POA: Insufficient documentation

## 2014-10-22 DIAGNOSIS — L97321 Non-pressure chronic ulcer of left ankle limited to breakdown of skin: Secondary | ICD-10-CM | POA: Diagnosis not present

## 2014-10-22 DIAGNOSIS — Z833 Family history of diabetes mellitus: Secondary | ICD-10-CM | POA: Diagnosis not present

## 2014-10-22 DIAGNOSIS — F17200 Nicotine dependence, unspecified, uncomplicated: Secondary | ICD-10-CM | POA: Diagnosis not present

## 2014-10-28 ENCOUNTER — Telehealth: Payer: Self-pay | Admitting: Internal Medicine

## 2014-10-28 NOTE — Telephone Encounter (Signed)
Patient would like to know if you will accept her back as a patient.  She is needing an appointment due to feeling light headed, ear pain...generally not feeling well.

## 2014-10-31 NOTE — Telephone Encounter (Signed)
ok 

## 2014-11-01 NOTE — Telephone Encounter (Signed)
LMOM for patient to callback and schedule appointment. Ok per Dr. Raliegh Ip.

## 2014-11-01 NOTE — Telephone Encounter (Signed)
Patient is scheduled   

## 2014-11-05 ENCOUNTER — Encounter: Payer: Self-pay | Admitting: Internal Medicine

## 2014-11-05 ENCOUNTER — Ambulatory Visit (INDEPENDENT_AMBULATORY_CARE_PROVIDER_SITE_OTHER): Payer: Self-pay | Admitting: Internal Medicine

## 2014-11-05 VITALS — HR 84 | Temp 98.2°F | Resp 20 | Ht 64.5 in | Wt 192.0 lb

## 2014-11-05 DIAGNOSIS — R42 Dizziness and giddiness: Secondary | ICD-10-CM

## 2014-11-05 MED ORDER — MECLIZINE HCL 25 MG PO CHEW: 1.0000 | CHEWABLE_TABLET | Freq: Four times a day (QID) | ORAL | Status: DC | PRN

## 2014-11-05 NOTE — Progress Notes (Signed)
Pre visit review using our clinic review tool, if applicable. No additional management support is needed unless otherwise documented below in the visit note. 

## 2014-11-05 NOTE — Progress Notes (Signed)
Subjective:    Patient ID: Becky Moore, female    DOB: 11-Sep-1970, 44 y.o.   MRN: [id number]  HPI  44 year old patient who is seen today with a number of concerns.  She was seen in the ED 2 months ago after an episode of dizziness associated with brief weakness involving her right arm and right leg.  She states that she bent over and stood up quickly and had the numbness and weakness involving her right arm and leg that lasted "8 seconds".  Symptoms have not reoccurred.  She continues to have occasional headaches, dizziness, and she also complains of memory loss.  She states that she has become more forgetful and at times feels off balance.  She has resumed tobacco products.  She does have a prior history of vertigo. She states her blood pressure generally runs low.  Blood pressure arrival today was 160 over 100 Presently, no health insurance, but plans to be covered in about 2 months.  2 months ago.  A head CT scan was performed and was normal  MMSE today 30/30  No past medical history on file.  History   Social History  . Marital Status: Single    Spouse Name: N/A  . Number of Children: N/A  . Years of Education: N/A   Occupational History  . Not on file.   Social History Main Topics  . Smoking status: Current Every Day Smoker -- 0.25 packs/day    Types: Cigarettes  . Smokeless tobacco: Never Used  . Alcohol Use: Yes     Comment: occ  . Drug Use: No  . Sexual Activity: Yes    Birth Control/ Protection: None   Other Topics Concern  . Not on file   Social History Narrative    Past Surgical History  Procedure Laterality Date  . Cholecystectomy    . Hernia repair      No family history on file.  No Known Allergies  Current Outpatient Prescriptions on File Prior to Visit  Medication Sig Dispense Refill  . naproxen sodium (ANAPROX) 220 MG tablet Take 440 mg by mouth 2 (two) times daily with a meal.     No current facility-administered medications on file  prior to visit.    Pulse 84  Temp(Src) 98.2 F (36.8 C) (Oral)  Resp 20  Ht 5' 4.5" (1.638 m)  Wt 192 lb (87.091 kg)  BMI 32.46 kg/m2  SpO2 98%  LMP 10/22/2014     Review of Systems  Constitutional: Negative.   HENT: Negative for congestion, dental problem, hearing loss, rhinorrhea, sinus pressure, sore throat and tinnitus.   Eyes: Negative for pain, discharge and visual disturbance.  Respiratory: Negative for cough and shortness of breath.   Cardiovascular: Negative for chest pain, palpitations and leg swelling.  Gastrointestinal: Negative for nausea, vomiting, abdominal pain, diarrhea, constipation, blood in stool and abdominal distention.  Genitourinary: Negative for dysuria, urgency, frequency, hematuria, flank pain, vaginal bleeding, vaginal discharge, difficulty urinating, vaginal pain and pelvic pain.  Musculoskeletal: Negative for joint swelling, arthralgias and gait problem.  Skin: Negative for rash.  Neurological: Positive for dizziness, light-headedness, numbness and headaches. Negative for syncope, speech difficulty and weakness.  Hematological: Negative for adenopathy.  Psychiatric/Behavioral: Negative for behavioral problems, dysphoric mood and agitation. The patient is not nervous/anxious.        Objective:   Physical Exam  Constitutional: She is oriented to person, place, and time. She appears well-developed and well-nourished.  Blood pressure 140/95  HENT:  Head: Normocephalic.  Right Ear: External ear normal.  Left Ear: External ear normal.  Mouth/Throat: Oropharynx is clear and moist.  Eyes: Conjunctivae and EOM are normal. Pupils are equal, round, and reactive to light.  Neck: Normal range of motion. Neck supple. No thyromegaly present.  No bruits  Cardiovascular: Normal rate, regular rhythm, normal heart sounds and intact distal pulses.   Pulmonary/Chest: Effort normal and breath sounds normal.  Abdominal: Soft. Bowel sounds are normal. She exhibits  no mass. There is no tenderness.  Musculoskeletal: Normal range of motion.  Lymphadenopathy:    She has no cervical adenopathy.  Neurological: She is alert and oriented to person, place, and time.  Normal finger to nose testing No drift Romberg normal Able to do a tandem walk  Skin: Skin is warm and dry. No rash noted.  Psychiatric: She has a normal mood and affect. Her behavior is normal.          Assessment & Plan:   History of vertigo Forgetfulness.  MMSE 30/30 History transient right-sided weakness.  Negative brain CT  We'll recheck in 2 months.  She report any clinical worsening or new symptoms.  Will consider brain MRI or neurology referral at that time.  Will need screening lab.  Serial blood pressure monitoring.  Encouraged

## 2014-11-05 NOTE — Patient Instructions (Signed)
Limit your sodium (Salt) intake  Please check your blood pressure on a regular basis.  If it is consistently greater than 150/90, please make an office appointment.  Return in 2 months for follow-up  Call or return to clinic prn if these symptoms worsen or fail to improve as anticipated.

## 2015-01-05 ENCOUNTER — Ambulatory Visit: Payer: Self-pay | Admitting: Internal Medicine

## 2015-05-02 ENCOUNTER — Ambulatory Visit (INDEPENDENT_AMBULATORY_CARE_PROVIDER_SITE_OTHER): Payer: BLUE CROSS/BLUE SHIELD | Admitting: Internal Medicine

## 2015-05-02 ENCOUNTER — Encounter: Payer: Self-pay | Admitting: Internal Medicine

## 2015-05-02 VITALS — BP 124/80 | HR 72 | Temp 98.0°F | Resp 20 | Ht 64.5 in | Wt 204.0 lb

## 2015-05-02 DIAGNOSIS — G451 Carotid artery syndrome (hemispheric): Secondary | ICD-10-CM

## 2015-05-02 DIAGNOSIS — Z0000 Encounter for general adult medical examination without abnormal findings: Secondary | ICD-10-CM | POA: Diagnosis not present

## 2015-05-02 DIAGNOSIS — R42 Dizziness and giddiness: Secondary | ICD-10-CM

## 2015-05-02 LAB — COMPREHENSIVE METABOLIC PANEL
ALK PHOS: 76 U/L (ref 39–117)
ALT: 13 U/L (ref 0–35)
AST: 16 U/L (ref 0–37)
Albumin: 3.9 g/dL (ref 3.5–5.2)
BUN: 14 mg/dL (ref 6–23)
CO2: 27 mEq/L (ref 19–32)
Calcium: 9.3 mg/dL (ref 8.4–10.5)
Chloride: 105 mEq/L (ref 96–112)
Creatinine, Ser: 0.83 mg/dL (ref 0.40–1.20)
GFR: 94.85 mL/min (ref >60.00)
Glucose, Bld: 105 mg/dL — ABNORMAL HIGH (ref 70–99)
Potassium: 4.6 mEq/L (ref 3.5–5.1)
SODIUM: 138 meq/L (ref 135–145)
TOTAL PROTEIN: 7.2 g/dL (ref 6.0–8.3)
Total Bilirubin: 0.3 mg/dL (ref 0.2–1.2)

## 2015-05-02 LAB — CBC WITH DIFFERENTIAL/PLATELET
Basophils Absolute: 0 10*3/uL (ref 0.0–0.1)
Basophils Relative: 0.4 % (ref 0.0–3.0)
Eosinophils Absolute: 0.3 10*3/uL (ref 0.0–0.7)
Eosinophils Relative: 2.3 % (ref 0.0–5.0)
HCT: 41.1 % (ref 36.0–46.0)
Hemoglobin: 13.6 g/dL (ref 12.0–15.0)
Lymphocytes Relative: 33.4 % (ref 12.0–46.0)
Lymphs Abs: 3.7 10*3/uL (ref 0.7–4.0)
MCHC: 33.2 g/dL (ref 30.0–36.0)
MCV: 91.4 fl (ref 78.0–100.0)
MONO ABS: 0.9 10*3/uL (ref 0.1–1.0)
Monocytes Relative: 8.6 % (ref 3.0–12.0)
Neutro Abs: 6.1 10*3/uL (ref 1.4–7.7)
Neutrophils Relative %: 55.3 % (ref 43.0–77.0)
Platelets: 261 10*3/uL (ref 150.0–400.0)
RBC: 4.49 Mil/uL (ref 3.87–5.11)
RDW: 13.4 % (ref 11.5–15.5)
WBC: 11 10*3/uL — AB (ref 4.0–10.5)

## 2015-05-02 LAB — LIPID PANEL
Cholesterol: 195 mg/dL (ref 0–200)
HDL: 55.2 mg/dL (ref >39.00)
LDL Cholesterol: 119 mg/dL — ABNORMAL HIGH (ref 0–99)
NonHDL: 139.91
TRIGLYCERIDES: 105 mg/dL (ref 0.0–149.0)
Total CHOL/HDL Ratio: 4
VLDL: 21 mg/dL (ref 0.0–40.0)

## 2015-05-02 LAB — TSH: TSH: 4.01 u[IU]/mL (ref 0.35–4.50)

## 2015-05-02 NOTE — Progress Notes (Signed)
Pre visit review using our clinic review tool, if applicable. No additional management support is needed unless otherwise documented below in the visit note. 

## 2015-05-02 NOTE — Patient Instructions (Signed)
Limit your sodium (Salt) intake    It is important that you exercise regularly, at least 20 minutes 3 to 4 times per week.  If you develop chest pain or shortness of breath seek  medical attention.  Smoking tobacco is very bad for your health. You should stop smoking immediately.  You need to lose weight.  Consider a lower calorie diet and regular exercise.

## 2015-05-02 NOTE — Progress Notes (Signed)
Subjective:    Patient ID: Becky Moore, female    DOB: 07-08-1970, 44 y.o.   MRN: [id number]  HPI  44 year old patient who is seen today in follow-up.  She was seen in the ED approximate 6 months ago for brief weakness involving her right arm and right leg.  The symptoms have not reoccurred.  She was seen in the ED and a head CT was normal.  She describes some vague lightheadedness and states that she feels a bit off balance when she does physical activities.  No other focal neurological symptoms.  No headaches.  No recent lab.  She is followed by gynecology  She has had a flu vaccine at work  She discontinued tobacco 3-4 months ago  No past medical history on file.  Social History   Social History  . Marital Status: Single    Spouse Name: N/A  . Number of Children: N/A  . Years of Education: N/A   Occupational History  . Not on file.   Social History Main Topics  . Smoking status: Current Every Day Smoker -- 0.25 packs/day    Types: Cigarettes  . Smokeless tobacco: Never Used  . Alcohol Use: Yes     Comment: occ  . Drug Use: No  . Sexual Activity: Yes    Birth Control/ Protection: None   Other Topics Concern  . Not on file   Social History Narrative    Past Surgical History  Procedure Laterality Date  . Cholecystectomy    . Hernia repair      No family history on file.  No Known Allergies  Current Outpatient Prescriptions on File Prior to Visit  Medication Sig Dispense Refill  . acetaminophen (TYLENOL) 500 MG tablet Take 500 mg by mouth every 6 (six) hours as needed.    . Meclizine HCl 25 MG CHEW Chew 1 tablet (25 mg total) by mouth 4 (four) times daily as needed. 90 each 0   No current facility-administered medications on file prior to visit.    BP 124/80 mmHg  Pulse 72  Temp(Src) 98 F (36.7 C) (Oral)  Resp 20  Ht 5' 4.5" (1.638 m)  Wt 204 lb (92.534 kg)  BMI 34.49 kg/m2  SpO2 98%  LMP 04/21/2015 (Approximate)     Review of  Systems  Constitutional: Negative.   HENT: Negative for congestion, dental problem, hearing loss, rhinorrhea, sinus pressure, sore throat and tinnitus.   Eyes: Negative for pain, discharge and visual disturbance.  Respiratory: Negative for cough and shortness of breath.   Cardiovascular: Negative for chest pain, palpitations and leg swelling.  Gastrointestinal: Negative for nausea, vomiting, abdominal pain, diarrhea, constipation, blood in stool and abdominal distention.  Genitourinary: Negative for dysuria, urgency, frequency, hematuria, flank pain, vaginal bleeding, vaginal discharge, difficulty urinating, vaginal pain and pelvic pain.  Musculoskeletal: Negative for joint swelling, arthralgias and gait problem.  Skin: Negative for rash.  Neurological: Positive for dizziness and light-headedness. Negative for syncope, speech difficulty, weakness, numbness and headaches.  Hematological: Negative for adenopathy.  Psychiatric/Behavioral: Negative for behavioral problems, dysphoric mood and agitation. The patient is not nervous/anxious.        Objective:   Physical Exam  Constitutional: She is oriented to person, place, and time. She appears well-developed and well-nourished.  HENT:  Head: Normocephalic.  Right Ear: External ear normal.  Left Ear: External ear normal.  Mouth/Throat: Oropharynx is clear and moist.  Eyes: Conjunctivae and EOM are normal. Pupils are equal, round, and reactive to  light.  Neck: Normal range of motion. Neck supple. No thyromegaly present.  Cardiovascular: Normal rate, regular rhythm, normal heart sounds and intact distal pulses.   Pulmonary/Chest: Effort normal and breath sounds normal.  Abdominal: Soft. Bowel sounds are normal. She exhibits no mass. There is no tenderness.  Musculoskeletal: Normal range of motion.  Lymphadenopathy:    She has no cervical adenopathy.  Neurological: She is alert and oriented to person, place, and time. She has normal reflexes.  No cranial nerve deficit. Coordination normal.  Normal finger to nose testing Able to do a tandem walk without difficulty  Skin: Skin is warm and dry. No rash noted.  Psychiatric: She has a normal mood and affect. Her behavior is normal.          Assessment & Plan:   History of brief right sided weakness.  Possible TIA.  Patient has been clinically stable but request a neurology referral We'll check screening lab Nonspecific dizziness. Permanent  Smoking cessation encouraged  Neurology consultation per patient request

## 2015-05-17 ENCOUNTER — Telehealth: Payer: Self-pay | Admitting: Internal Medicine

## 2015-05-17 NOTE — Telephone Encounter (Signed)
Spoke to pt and went over lab results, told her labs were good, just LDL mildly elevated which is bad cholesterol. Try to follow low cholesterol diet. Pt verbalized understanding.

## 2015-05-17 NOTE — Telephone Encounter (Signed)
Patient wants a return call to discuss what the items on her 05/02/15 lab results mean.

## 2015-06-13 ENCOUNTER — Ambulatory Visit (INDEPENDENT_AMBULATORY_CARE_PROVIDER_SITE_OTHER): Payer: BLUE CROSS/BLUE SHIELD | Admitting: Neurology

## 2015-06-13 ENCOUNTER — Other Ambulatory Visit: Payer: BLUE CROSS/BLUE SHIELD

## 2015-06-13 ENCOUNTER — Encounter: Payer: Self-pay | Admitting: Neurology

## 2015-06-13 VITALS — BP 130/84 | HR 73 | Ht 65.0 in | Wt 199.0 lb

## 2015-06-13 DIAGNOSIS — Z720 Tobacco use: Secondary | ICD-10-CM | POA: Diagnosis not present

## 2015-06-13 DIAGNOSIS — R42 Dizziness and giddiness: Secondary | ICD-10-CM

## 2015-06-13 DIAGNOSIS — R531 Weakness: Secondary | ICD-10-CM

## 2015-06-13 DIAGNOSIS — M6289 Other specified disorders of muscle: Secondary | ICD-10-CM | POA: Diagnosis not present

## 2015-06-13 NOTE — Progress Notes (Signed)
NEUROLOGY CONSULTATION NOTE  Becky Moore MRN: [id number] DOB: 1971-05-21  Referring provider: Dr. Burnice Logan Primary care provider: Dr. Burnice Logan  Reason for consult:  Transient right sided weakness, lightheadedness  HISTORY OF PRESENT ILLNESS: Becky Moore is a 44 year old right-handed female, current smoker, who presents for transient right sided weakness.  History obtained by patient, ED and PCP notes.  Images of head CT reviewed.  Labs reviewed.  On 09/05/14, she bent over to pick something up with her right hand.  When she stood up straight, she felt lightheaded and had trouble using her right upper extremity.  Her right leg also felt weak.  She said that her right upper extremity felt numb and she was unable to hold the object in her right hand.  There was no associated headache.  It lasted for only a couple of seconds  She went to the ED for further evaluation.  She was not orthostatic in the ED.  CT of the head and EKG were unremarkable.  She has not had any recurrent spells, but sometimes she still feels lightheaded.  She also has history of vertigo in the past.    She is a smoker but otherwise has no stroke risk factors or history of migraine.  She reports that her niece had a stroke at age 69. Recent LDL was 119.  PAST MEDICAL HISTORY: No past medical history on file.  PAST SURGICAL HISTORY: Past Surgical History  Procedure Laterality Date  . Cholecystectomy    . Hernia repair      MEDICATIONS: Current Outpatient Prescriptions on File Prior to Visit  Medication Sig Dispense Refill  . acetaminophen (TYLENOL) 500 MG tablet Take 500 mg by mouth every 6 (six) hours as needed.    . Meclizine HCl 25 MG CHEW Chew 1 tablet (25 mg total) by mouth 4 (four) times daily as needed. 90 each 0   No current facility-administered medications on file prior to visit.    ALLERGIES: No Known Allergies  FAMILY HISTORY: No family history on file.  SOCIAL HISTORY: Social  History   Social History  . Marital Status: Single    Spouse Name: N/A  . Number of Children: N/A  . Years of Education: N/A   Occupational History  . Not on file.   Social History Main Topics  . Smoking status: Current Every Day Smoker -- 0.25 packs/day    Types: Cigarettes  . Smokeless tobacco: Never Used  . Alcohol Use: Yes     Comment: occ  . Drug Use: No  . Sexual Activity: Yes    Birth Control/ Protection: None   Other Topics Concern  . Not on file   Social History Narrative   Some college     REVIEW OF SYSTEMS: Constitutional: No fevers, chills, or sweats, no generalized fatigue, change in appetite Eyes: No visual changes, double vision, eye pain Ear, nose and throat: No hearing loss, ear pain, nasal congestion, sore throat Cardiovascular: No chest pain, palpitations Respiratory:  No shortness of breath at rest or with exertion, wheezes GastrointestinaI: No nausea, vomiting, diarrhea, abdominal pain, fecal incontinence Genitourinary:  No dysuria, urinary retention or frequency Musculoskeletal:  No neck pain, back pain Integumentary: No rash, pruritus, skin lesions Neurological: as above Psychiatric: No depression, insomnia, anxiety Endocrine: No palpitations, fatigue, diaphoresis, mood swings, change in appetite, change in weight, increased thirst Hematologic/Lymphatic:  No anemia, purpura, petechiae. Allergic/Immunologic: no itchy/runny eyes, nasal congestion, recent allergic reactions, rashes  PHYSICAL EXAM: Filed Vitals:  06/13/15 0921  BP: 130/84  Pulse: 73   General: No acute distress.  Patient appears well-groomed.  Head:  Normocephalic/atraumatic Eyes:  fundi unremarkable, without vessel changes, exudates, hemorrhages or papilledema. Neck: supple, no paraspinal tenderness, full range of motion Back: No paraspinal tenderness Heart: regular rate and rhythm Lungs: Clear to auscultation bilaterally. Vascular: No carotid bruits. Neurological  Exam: Mental status: alert and oriented to person, place, and time, recent and remote memory intact, fund of knowledge intact, attention and concentration intact, speech fluent and not dysarthric, language intact. Cranial nerves: CN I: not tested CN II: pupils equal, round and reactive to light, visual fields intact, fundi unremarkable, without vessel changes, exudates, hemorrhages or papilledema. CN III, IV, VI:  full range of motion, no nystagmus, no ptosis CN V: facial sensation intact CN VII: upper and lower face symmetric CN VIII: hearing intact CN IX, X: gag intact, uvula midline CN XI: sternocleidomastoid and trapezius muscles intact CN XII: tongue midline Bulk & Tone: normal, no fasciculations. Motor:  5/5 throughout Sensation: temperature and vibration sensation intact. Deep Tendon Reflexes:  2+ throughout, toes downgoing.  Finger to nose testing:  Without dysmetria.  Heel to shin:  Without dysmetria.  Gait:  Normal station and stride.  Able to turn and tandem walk. Romberg negative.  IMPRESSION: Transient brief right sided numbness and weakness.  Unclear.  Since it occurred for a couple of seconds with change in position, consider hypoperfusion involving the left cerebral hemisphere.  PLAN: She warrants further TIA workup 1.  MRI and MRA of head 2.  Carotid doppler 3.  2D echo 4.  Hypercoagulable panel 5.  Smoking cessaton 5.  Further recommendations pending results.  Discussed need to possibly start ASA 81mg  daily.  Thank you for allowing me to take part in the care of this patient.  Metta Clines, DO  CC:  Bluford Kaufmann, MD

## 2015-06-13 NOTE — Addendum Note (Signed)
Addended by: Gerda Diss A on: 06/13/2015 11:01 AM   Modules accepted: Orders

## 2015-06-13 NOTE — Patient Instructions (Addendum)
It is not clear what happened, but we will finish a stroke workup 1.  MRI and MRA of head 2.  Carotid doppler 3.  2D echo 4.  Will contact you with results and recommendations.  5.  Quit smoking 6.  Start aspirin 81mg  daily

## 2015-06-15 LAB — RFX DRVVT SCR W/RFLX CONF 1:1 MIX: DRVVT SCREEN: 41 s (ref <=45)

## 2015-06-15 LAB — RFX PTT-LA W/RFX TO HEX PHASE CONF: PTT-LA SCREEN: 45 s — AB (ref <=40)

## 2015-06-15 LAB — RFLX HEXAGONAL PHASE CONFIRM: HEXAGONAL PHASE CONFIRM: NEGATIVE

## 2015-06-16 ENCOUNTER — Other Ambulatory Visit: Payer: Self-pay

## 2015-06-16 ENCOUNTER — Ambulatory Visit (HOSPITAL_COMMUNITY): Payer: BLUE CROSS/BLUE SHIELD | Attending: Cardiology

## 2015-06-16 DIAGNOSIS — F17200 Nicotine dependence, unspecified, uncomplicated: Secondary | ICD-10-CM | POA: Insufficient documentation

## 2015-06-16 DIAGNOSIS — M6289 Other specified disorders of muscle: Secondary | ICD-10-CM | POA: Diagnosis not present

## 2015-06-16 DIAGNOSIS — G459 Transient cerebral ischemic attack, unspecified: Secondary | ICD-10-CM | POA: Diagnosis present

## 2015-06-16 DIAGNOSIS — R42 Dizziness and giddiness: Secondary | ICD-10-CM

## 2015-06-16 DIAGNOSIS — I340 Nonrheumatic mitral (valve) insufficiency: Secondary | ICD-10-CM | POA: Insufficient documentation

## 2015-06-16 DIAGNOSIS — I5189 Other ill-defined heart diseases: Secondary | ICD-10-CM | POA: Diagnosis not present

## 2015-06-16 DIAGNOSIS — I071 Rheumatic tricuspid insufficiency: Secondary | ICD-10-CM | POA: Diagnosis not present

## 2015-06-16 DIAGNOSIS — I253 Aneurysm of heart: Secondary | ICD-10-CM | POA: Insufficient documentation

## 2015-06-16 DIAGNOSIS — R531 Weakness: Secondary | ICD-10-CM

## 2015-06-16 LAB — HYPERCOAGULABLE PANEL, COMPREHENSIVE
ANTITHROMB III FUNC: 120 %{activity} (ref 80–120)
Anticardiolipin IgA: <11 [APL'U]
Anticardiolipin IgG: <14 [GPL'U]
Anticardiolipin IgM: <12 [MPL'U]
Beta-2 Glyco I IgG: <9 SGU (ref <=20)
Beta-2-Glycoprotein I IgA: 9 SAU (ref <=20)
Beta-2-Glycoprotein I IgM: <9 SMU (ref <=20)
PROTEIN C ANTIGEN: 113 % (ref 70–140)
PROTEIN S ANTIGEN, TOTAL: 105 % (ref 70–140)
Protein C Activity: 115 % (ref 70–180)
Protein S Activity: 116 % (ref 60–140)

## 2015-06-17 ENCOUNTER — Telehealth: Payer: Self-pay | Admitting: Neurology

## 2015-06-17 NOTE — Telephone Encounter (Signed)
I spoke with Becky Moore.  Hypercoagulable panel was unremarkable.  Echo showed atrial septal aneurysm.  I recommended starting ASA 81mg  daily.  She is scheduled for MRI and MRA next week and we will contact her with those results when available.  I answered all questions to the best of my ability.

## 2015-06-20 ENCOUNTER — Other Ambulatory Visit: Payer: Self-pay

## 2015-06-20 DIAGNOSIS — R29898 Other symptoms and signs involving the musculoskeletal system: Secondary | ICD-10-CM

## 2015-06-20 DIAGNOSIS — R42 Dizziness and giddiness: Secondary | ICD-10-CM

## 2015-06-21 ENCOUNTER — Telehealth: Payer: Self-pay | Admitting: Neurology

## 2015-06-21 NOTE — Telephone Encounter (Signed)
Patient calling for Echo results. Please advise.

## 2015-06-21 NOTE — Telephone Encounter (Signed)
Patient would like to know if Nissen fundoplication surgery she had for her GERD caused the aneurysm. Patient would also like to know if it will be something to worry about in the future.

## 2015-06-21 NOTE — Telephone Encounter (Signed)
No, I don't think the surgery caused it.  It isn't something that would require further testing.

## 2015-06-21 NOTE — Telephone Encounter (Signed)
Please see telephone encounter from 12/16.  Echo showed a small aneurysm but it wouldn't change management.  We discussed this on the phone last week.

## 2015-06-21 NOTE — Telephone Encounter (Signed)
Message relayed to patient. Verbalized understanding and denied questions.   

## 2015-06-21 NOTE — Telephone Encounter (Signed)
Pt called with concerns/questions about her Echo Cardiogram test/ call back @ [phone]

## 2015-06-22 ENCOUNTER — Ambulatory Visit
Admission: RE | Admit: 2015-06-22 | Discharge: 2015-06-22 | Disposition: A | Discharge status: Home / Self Care | Payer: BLUE CROSS/BLUE SHIELD | Source: Ambulatory Visit | Attending: Neurology | Admitting: Neurology

## 2015-06-22 ENCOUNTER — Other Ambulatory Visit: Payer: Self-pay

## 2015-06-22 DIAGNOSIS — R42 Dizziness and giddiness: Secondary | ICD-10-CM

## 2015-06-22 DIAGNOSIS — R531 Weakness: Secondary | ICD-10-CM

## 2015-06-22 DIAGNOSIS — R29898 Other symptoms and signs involving the musculoskeletal system: Secondary | ICD-10-CM

## 2015-06-22 MED ORDER — GADOBENATE DIMEGLUMINE 529 MG/ML IV SOLN
18.0000 mL | Freq: Once | INTRAVENOUS | Status: AC | PRN
  Administered 2015-06-22: 18 mL via INTRAVENOUS

## 2015-06-23 ENCOUNTER — Telehealth: Payer: Self-pay

## 2015-06-23 NOTE — Telephone Encounter (Signed)
-----   Message from Pieter Partridge, DO sent at 06/23/2015  7:11 AM EST ----- MRI and MRA of head were normal. At this point, I would treat it as a transient ischemic attack. Therefore, my recommendations: 1. Take aspirin 81mg  daily 2. I would have her start a cholesterol-lowering medication (statin drug). In October, the LDL (which is the bad cholesterol) was 119. Ideally, it should be less than 70). I would have her discuss with her PCP, Dr. Burnice Logan, regarding which medication to start. I will route this message to him as well.  No follow up can be as needed.

## 2015-06-23 NOTE — Telephone Encounter (Signed)
Message relayed to patient. Verbalized understanding and denied questions.   

## 2015-06-30 ENCOUNTER — Other Ambulatory Visit (INDEPENDENT_AMBULATORY_CARE_PROVIDER_SITE_OTHER): Payer: BLUE CROSS/BLUE SHIELD

## 2015-06-30 DIAGNOSIS — Z0000 Encounter for general adult medical examination without abnormal findings: Secondary | ICD-10-CM | POA: Diagnosis not present

## 2015-06-30 LAB — POCT URINALYSIS DIPSTICK
BILIRUBIN UA: NEGATIVE
Glucose, UA: NEGATIVE
KETONES UA: NEGATIVE
Leukocytes, UA: NEGATIVE
NITRITE UA: NEGATIVE
PH UA: 6.5
Protein, UA: NEGATIVE
Spec Grav, UA: 1.015
Urobilinogen, UA: 0.2

## 2015-06-30 LAB — CBC WITH DIFFERENTIAL/PLATELET
BASOS PCT: 0.3 % (ref 0.0–3.0)
Basophils Absolute: 0 10*3/uL (ref 0.0–0.1)
EOS ABS: 0.2 10*3/uL (ref 0.0–0.7)
Eosinophils Relative: 1.7 % (ref 0.0–5.0)
HCT: 41.2 % (ref 36.0–46.0)
HEMOGLOBIN: 13.7 g/dL (ref 12.0–15.0)
LYMPHS ABS: 4 10*3/uL (ref 0.7–4.0)
Lymphocytes Relative: 37.1 % (ref 12.0–46.0)
MCHC: 33.3 g/dL (ref 30.0–36.0)
MCV: 91.1 fl (ref 78.0–100.0)
MONO ABS: 1 10*3/uL (ref 0.1–1.0)
Monocytes Relative: 9.3 % (ref 3.0–12.0)
NEUTROS PCT: 51.6 % (ref 43.0–77.0)
Neutro Abs: 5.6 10*3/uL (ref 1.4–7.7)
Platelets: 253 10*3/uL (ref 150.0–400.0)
RBC: 4.52 Mil/uL (ref 3.87–5.11)
RDW: 13.6 % (ref 11.5–15.5)
WBC: 10.9 10*3/uL — AB (ref 4.0–10.5)

## 2015-06-30 LAB — LIPID PANEL
Cholesterol: 210 mg/dL — ABNORMAL HIGH (ref 0–200)
HDL: 49.9 mg/dL (ref >39.00)
LDL Cholesterol: 142 mg/dL — ABNORMAL HIGH (ref 0–99)
NONHDL: 160.2
Total CHOL/HDL Ratio: 4
Triglycerides: 89 mg/dL (ref 0.0–149.0)
VLDL: 17.8 mg/dL (ref 0.0–40.0)

## 2015-06-30 LAB — HEPATIC FUNCTION PANEL
ALBUMIN: 4.3 g/dL (ref 3.5–5.2)
ALT: 15 U/L (ref 0–35)
AST: 17 U/L (ref 0–37)
Alkaline Phosphatase: 70 U/L (ref 39–117)
BILIRUBIN DIRECT: 0.1 mg/dL (ref 0.0–0.3)
BILIRUBIN TOTAL: 0.5 mg/dL (ref 0.2–1.2)
TOTAL PROTEIN: 7.9 g/dL (ref 6.0–8.3)

## 2015-06-30 LAB — BASIC METABOLIC PANEL
BUN: 12 mg/dL (ref 6–23)
CHLORIDE: 102 meq/L (ref 96–112)
CO2: 28 mEq/L (ref 19–32)
CREATININE: 0.81 mg/dL (ref 0.40–1.20)
Calcium: 10 mg/dL (ref 8.4–10.5)
GFR: 97.49 mL/min (ref >60.00)
GLUCOSE: 84 mg/dL (ref 70–99)
Potassium: 4 mEq/L (ref 3.5–5.1)
Sodium: 139 mEq/L (ref 135–145)

## 2015-06-30 LAB — TSH: TSH: 3.97 u[IU]/mL (ref 0.35–4.50)

## 2015-07-01 ENCOUNTER — Ambulatory Visit (INDEPENDENT_AMBULATORY_CARE_PROVIDER_SITE_OTHER): Payer: BLUE CROSS/BLUE SHIELD | Admitting: Physician Assistant

## 2015-07-01 ENCOUNTER — Telehealth: Payer: Self-pay | Admitting: Internal Medicine

## 2015-07-01 VITALS — BP 122/82 | HR 74 | Temp 97.6°F | Resp 17 | Ht 65.5 in | Wt 199.0 lb

## 2015-07-01 DIAGNOSIS — E7800 Pure hypercholesterolemia, unspecified: Secondary | ICD-10-CM | POA: Diagnosis not present

## 2015-07-01 DIAGNOSIS — K0889 Other specified disorders of teeth and supporting structures: Secondary | ICD-10-CM

## 2015-07-01 DIAGNOSIS — B379 Candidiasis, unspecified: Secondary | ICD-10-CM | POA: Diagnosis not present

## 2015-07-01 DIAGNOSIS — K047 Periapical abscess without sinus: Secondary | ICD-10-CM | POA: Diagnosis not present

## 2015-07-01 DIAGNOSIS — T3695XA Adverse effect of unspecified systemic antibiotic, initial encounter: Secondary | ICD-10-CM

## 2015-07-01 MED ORDER — CHLORHEXIDINE GLUCONATE 0.12% ORAL RINSE (MEDLINE KIT): 15.0000 mL | Freq: Two times a day (BID) | OROMUCOSAL | Status: DC

## 2015-07-01 MED ORDER — AMOXICILLIN-POT CLAVULANATE 875-125 MG PO TABS: 1.0000 | ORAL_TABLET | Freq: Two times a day (BID) | ORAL | Status: DC

## 2015-07-01 MED ORDER — HYDROCODONE-ACETAMINOPHEN 5-325 MG PO TABS: 1.0000 | ORAL_TABLET | Freq: Four times a day (QID) | ORAL | Status: DC | PRN

## 2015-07-01 MED ORDER — FLUCONAZOLE 150 MG PO TABS: 150.0000 mg | ORAL_TABLET | Freq: Once | ORAL | Status: DC

## 2015-07-01 MED ORDER — ATORVASTATIN CALCIUM 40 MG PO TABS: 40.0000 mg | ORAL_TABLET | Freq: Every day | ORAL | Status: DC

## 2015-07-01 NOTE — Progress Notes (Signed)
[facility] [address], [place] [address] [phone]  Date:  07/01/2015   Name:  Becky Moore   DOB:  July 31, 1970   MRN:  [id number]  PCP:  Nyoka Cowden, MD    History of Present Illness:  Becky Moore is a 44 y.o. female patient who presents to [facility] for cc of tooth pain and medication for high cholesterol Tooth pain of left bottom molar.  It is loose and tender.  Pain at the base of chin.  No fever or bleeding.    She also has elevated cholesterol and recent hx of tia.  There was advice to start a statin.  She is concerned that her insurance is going to drop as she was laid off days ago.  She would like to get this started prior to the insurance dropping.    Patient Active Problem List   Diagnosis Date Noted  . Tobacco abuse 06/13/2015  . UTI (lower urinary tract infection) 04/08/2013  . Abdominal pain 04/08/2013  . HEADACHE 03/28/2010  . VERTIGO 12/30/2009  . DYSPNEA 12/01/2009  . CHEST PAIN 12/01/2009  . HIATAL HERNIA 08/31/2009  . OTHER CONSTIPATION 08/18/2009  . CHOLECYSTECTOMY, LAPAROSCOPIC, HX OF 08/18/2009  . DEPRESSION 08/15/2009  . DYSPHAGIA UNSPECIFIED 08/15/2009  . SORE THROAT 12/08/2007  . GERD 09/04/2007    No past medical history on file.  Past Surgical History  Procedure Laterality Date  . Cholecystectomy    . Hernia repair      Social History  Substance Use Topics  . Smoking status: Former Smoker -- 0.25 packs/day    Types: Cigarettes    Quit date: 03/22/2015  . Smokeless tobacco: Never Used  . Alcohol Use: 0.0 oz/week    0 Standard drinks or equivalent per week     Comment: occ    No family history on file.  No Known Allergies  Medication list has been reviewed and updated.  Current Outpatient Prescriptions on File Prior to Visit  Medication Sig Dispense Refill  . Meclizine HCl 25 MG CHEW Chew 1 tablet (25 mg total) by mouth 4 (four) times daily as needed. 90 each 0  . acetaminophen  (TYLENOL) 500 MG tablet Take 500 mg by mouth every 6 (six) hours as needed. Reported on 07/01/2015     No current facility-administered medications on file prior to visit.    ROS ROS otherwise unremarkable unless listed above.  Physical Examination: BP 122/82 mmHg  Pulse 74  Temp(Src) 97.6 F (36.4 C) (Oral)  Resp 17  Ht 5' 5.5" (1.664 m)  Wt 199 lb (90.266 kg)  BMI 32.60 kg/m2  SpO2 98% Ideal Body Weight: Weight in (lb) to have BMI = 25: 152.2  Physical Exam  Constitutional: She is oriented to person, place, and time. She appears well-developed and well-nourished. No distress.  HENT:  Head: Normocephalic and atraumatic.  Right Ear: External ear normal.  Left Ear: External ear normal.  Mouth/Throat: No oropharyngeal exudate, posterior oropharyngeal edema or posterior oropharyngeal erythema.  Poor dentition with one lone molar of left side.  Gum has a linear .5cm laceration with localized erythema surrounding.  Tender to palpation.  Tooth is very loose.  Appears to have filling that is in place.   Minimal swelling at the chin with tenderness upon palpation.  No erythema.  Eyes: Conjunctivae and EOM are normal. Pupils are equal, round, and reactive to light.  Cardiovascular: Normal rate.   Pulmonary/Chest: Effort normal. No respiratory distress.  Neurological: She is alert and oriented to person, place, and time.  Skin: She is not diaphoretic.  Psychiatric: She has a normal mood and affect. Her behavior is normal.     Assessment and Plan: Becky Moore is a 44 y.o. female who is here today for cc of tooth pain. -advised to get with dentist asap. -advised to gargle and given augmentin.  norco given with 20 tablets -starting atorvastatin at this time   She will have visit with Dr. Burnice Logan at in a few weeks.  She states that she is still going to have this appt. -yeast infection with abx use.  Diflucan given   Tooth pain - Plan: HYDROcodone-acetaminophen (NORCO) 5-325  MG tablet  Tooth infection - Plan: chlorhexidine gluconate (PERIDEX) 0.12 % solution, amoxicillin-clavulanate (AUGMENTIN) 875-125 MG tablet, HYDROcodone-acetaminophen (NORCO) 5-325 MG tablet  Antibiotic-induced yeast infection - Plan: fluconazole (DIFLUCAN) 150 MG tablet  Pure hypercholesterolemia - Plan: atorvastatin (LIPITOR) 40 MG tablet  Ivar Drape, PA-C [facility] 07/01/2015 4:41 PM

## 2015-07-01 NOTE — Patient Instructions (Addendum)
Dental Pain Dental pain may be caused by many things, including:  Tooth decay (cavities or caries). Cavities expose the nerve of your tooth to air and hot or cold temperatures. This can cause pain or discomfort.  Abscess or infection. A dental abscess is a collection of infected pus from a bacterial infection in the inner part of the tooth (pulp). It usually occurs at the end of the tooth's root.  Injury.  An unknown reason (idiopathic). Your pain may be mild or severe. It may only occur when:  You are chewing.  You are exposed to hot or cold temperature.  You are eating or drinking sugary foods or beverages, such as soda or candy. Your pain may also be constant. HOME CARE INSTRUCTIONS Watch your dental pain for any changes. The following actions may help to lessen any discomfort that you are feeling:  Take medicines only as directed by your dentist.  If you were prescribed an antibiotic medicine, finish all of it even if you start to feel better.  Keep all follow-up visits as directed by your dentist. This is important.  Do not apply heat to the outside of your face.  Rinse your mouth or gargle with salt water if directed by your dentist. This helps with pain and swelling.  You can make salt water by adding  tsp of salt to 1 cup of warm water.  Apply ice to the painful area of your face:  Put ice in a plastic bag.  Place a towel between your skin and the bag.  Leave the ice on for 20 minutes, 2-3 times per day.  Avoid foods or drinks that cause you pain, such as:  Very hot or very cold foods or drinks.  Sweet or sugary foods or drinks. SEEK MEDICAL CARE IF:  Your pain is not controlled with medicines.  Your symptoms are worse.  You have new symptoms. SEEK IMMEDIATE MEDICAL CARE IF:  You are unable to open your mouth.  You are having trouble breathing or swallowing.  You have a fever.  Your face, neck, or jaw is swollen.   This information is not  intended to replace advice given to you by your health care provider. Make sure you discuss any questions you have with your health care provider.   Document Released: 06/18/2005 Document Revised: 11/02/2014 Document Reviewed: 06/14/2014 Elsevier Interactive Patient Education 2016 Elsevier Inc. Atorvastatin tablets What is this medicine? ATORVASTATIN (a TORE va sta tin) is known as a HMG-CoA reductase inhibitor or 'statin'. It lowers the level of cholesterol and triglycerides in the blood. This drug may also reduce the risk of heart attack, stroke, or other health problems in patients with risk factors for heart disease. Diet and lifestyle changes are often used with this drug. This medicine may be used for other purposes; ask your health care provider or pharmacist if you have questions. What should I tell my health care provider before I take this medicine? They need to know if you have any of these conditions: -frequently drink alcoholic beverages -history of stroke, TIA -kidney disease -liver disease -muscle aches or weakness -other medical condition -an unusual or allergic reaction to atorvastatin, other medicines, foods, dyes, or preservatives -pregnant or trying to get pregnant -breast-feeding How should I use this medicine? Take this medicine by mouth with a glass of water. Follow the directions on the prescription label. You can take this medicine with or without food. Take your doses at regular intervals. Do not take your medicine  more often than directed. Talk to your pediatrician regarding the use of this medicine in children. While this drug may be prescribed for children as young as 28 years old for selected conditions, precautions do apply. Overdosage: If you think you have taken too much of this medicine contact a poison control center or emergency room at once. NOTE: This medicine is only for you. Do not share this medicine with others. What if I miss a dose? If you miss a  dose, take it as soon as you can. If it is almost time for your next dose, take only that dose. Do not take double or extra doses. What may interact with this medicine? Do not take this medicine with any of the following medications: -red yeast rice -telaprevir -telithromycin -voriconazole This medicine may also interact with the following medications: -alcohol -antiviral medicines for HIV or AIDS -boceprevir -certain antibiotics like clarithromycin, erythromycin, troleandomycin -certain medicines for cholesterol like fenofibrate or gemfibrozil -cimetidine -clarithromycin -colchicine -cyclosporine -digoxin -female hormones, like estrogens or progestins and birth control pills -grapefruit juice -medicines for fungal infections like fluconazole, itraconazole, ketoconazole -niacin -rifampin -spironolactone This list may not describe all possible interactions. Give your health care provider a list of all the medicines, herbs, non-prescription drugs, or dietary supplements you use. Also tell them if you smoke, drink alcohol, or use illegal drugs. Some items may interact with your medicine. What should I watch for while using this medicine? Visit your doctor or health care professional for regular check-ups. You may need regular tests to make sure your liver is working properly. Tell your doctor or health care professional right away if you get any unexplained muscle pain, tenderness, or weakness, especially if you also have a fever and tiredness. Your doctor or health care professional may tell you to stop taking this medicine if you develop muscle problems. If your muscle problems do not go away after stopping this medicine, contact your health care professional. This drug is only part of a total heart-health program. Your doctor or a dietician can suggest a low-cholesterol and low-fat diet to help. Avoid alcohol and smoking, and keep a proper exercise schedule. Do not use this drug if you  are pregnant or breast-feeding. Serious side effects to an unborn child or to an infant are possible. Talk to your doctor or pharmacist for more information. This medicine may affect blood sugar levels. If you have diabetes, check with your doctor or health care professional before you change your diet or the dose of your diabetic medicine. If you are going to have surgery tell your health care professional that you are taking this drug. What side effects may I notice from receiving this medicine? Side effects that you should report to your doctor or health care professional as soon as possible: -allergic reactions like skin rash, itching or hives, swelling of the face, lips, or tongue -dark urine -fever -joint pain -muscle cramps, pain -redness, blistering, peeling or loosening of the skin, including inside the mouth -trouble passing urine or change in the amount of urine -unusually weak or tired -yellowing of eyes or skin Side effects that usually do not require medical attention (report to your doctor or health care professional if they continue or are bothersome): -constipation -heartburn -stomach gas, pain, upset This list may not describe all possible side effects. Call your doctor for medical advice about side effects. You may report side effects to FDA at 1-800-FDA-1088. Where should I keep my medicine? Keep out of the reach  of children. Store at room temperature between 20 to 25 degrees C (68 to 77 degrees F). Throw away any unused medicine after the expiration date. NOTE: This sheet is a summary. It may not cover all possible information. If you have questions about this medicine, talk to your doctor, pharmacist, or health care provider.    2016, Elsevier/Gold Standard. (2011-05-08 [id number])

## 2015-07-01 NOTE — Telephone Encounter (Signed)
Becky Moore called saying she found out yesterday she's being laid off and her health insurance ends at midnight tonight. She needs her cholesterol checked because her Neurologist told her it was increased and she needs to be on medication. Her doctors are trying to figure out if she had a stroke per the pt. Please give her a phone call regarding this.   Pt's ph# [phone] Thank you.

## 2015-07-04 NOTE — Telephone Encounter (Signed)
Okay to check cholesterol at patient's convenience.  No urgency, and certainly can wait until after she has insurance coverage

## 2015-07-05 NOTE — Telephone Encounter (Signed)
Spoke to pt, told her can have cholesterol checked per Dr. Raliegh Ip but at your convenience. No urgency can wait till you have insurance. Pt said she already had labs done and is scheduled to come in on Jan 6 th. Told pt okay.

## 2015-07-06 ENCOUNTER — Encounter: Payer: Self-pay | Admitting: Physician Assistant

## 2015-07-08 ENCOUNTER — Encounter: Payer: Self-pay | Admitting: Internal Medicine

## 2015-07-08 ENCOUNTER — Ambulatory Visit (INDEPENDENT_AMBULATORY_CARE_PROVIDER_SITE_OTHER): Payer: BLUE CROSS/BLUE SHIELD | Admitting: Internal Medicine

## 2015-07-08 VITALS — BP 120/80 | HR 85 | Temp 98.5°F | Resp 20 | Ht 65.25 in | Wt 200.0 lb

## 2015-07-08 DIAGNOSIS — Z0000 Encounter for general adult medical examination without abnormal findings: Secondary | ICD-10-CM

## 2015-07-08 DIAGNOSIS — E7800 Pure hypercholesterolemia, unspecified: Secondary | ICD-10-CM

## 2015-07-08 DIAGNOSIS — K219 Gastro-esophageal reflux disease without esophagitis: Secondary | ICD-10-CM

## 2015-07-08 MED ORDER — ATORVASTATIN CALCIUM 40 MG PO TABS: 20.0000 mg | ORAL_TABLET | Freq: Every day | ORAL | Status: DC

## 2015-07-08 MED ORDER — ASPIRIN EC 81 MG PO TBEC: 81.0000 mg | DELAYED_RELEASE_TABLET | Freq: Every day | ORAL | Status: DC

## 2015-07-08 NOTE — Progress Notes (Signed)
Pre visit review using our clinic review tool, if applicable. No additional management support is needed unless otherwise documented below in the visit note. 

## 2015-07-08 NOTE — Patient Instructions (Signed)
Health Maintenance, Female Adopting a healthy lifestyle and getting preventive care can go a long way to promote health and wellness. Talk with your health care provider about what schedule of regular examinations is right for you. This is a good chance for you to check in with your provider about disease prevention and staying healthy. In between checkups, there are plenty of things you can do on your own. Experts have done a lot of research about which lifestyle changes and preventive measures are most likely to keep you healthy. Ask your health care provider for more information. WEIGHT AND DIET  Eat a healthy diet  Be sure to include plenty of vegetables, fruits, low-fat dairy products, and lean protein.  Do not eat a lot of foods high in solid fats, added sugars, or salt.  Get regular exercise. This is one of the most important things you can do for your health.  Most adults should exercise for at least 150 minutes each week. The exercise should increase your heart rate and make you sweat (moderate-intensity exercise).  Most adults should also do strengthening exercises at least twice a week. This is in addition to the moderate-intensity exercise.  Maintain a healthy weight  Body mass index (BMI) is a measurement that can be used to identify possible weight problems. It estimates body fat based on height and weight. Your health care provider can help determine your BMI and help you achieve or maintain a healthy weight.  For females 24 years of age and older:   A BMI below 18.5 is considered underweight.  A BMI of 18.5 to 24.9 is normal.  A BMI of 25 to 29.9 is considered overweight.  A BMI of 30 and above is considered obese.  Watch levels of cholesterol and blood lipids  You should start having your blood tested for lipids and cholesterol at 47 years of age, then have this test every 5 years.  You may need to have your cholesterol levels checked more often if:  Your lipid  or cholesterol levels are high.  You are older than 47 years of age.  You are at high risk for heart disease.  CANCER SCREENING   Lung Cancer  Lung cancer screening is recommended for adults 5-2 years old who are at high risk for lung cancer because of a history of smoking.  A yearly low-dose CT scan of the lungs is recommended for people who:  Currently smoke.  Have quit within the past 15 years.  Have at least a 30-pack-year history of smoking. A pack year is smoking an average of one pack of cigarettes a day for 1 year.  Yearly screening should continue until it has been 15 years since you quit.  Yearly screening should stop if you develop a health problem that would prevent you from having lung cancer treatment.  Breast Cancer  Practice breast self-awareness. This means understanding how your breasts normally appear and feel.  It also means doing regular breast self-exams. Let your health care provider know about any changes, no matter how small.  If you are in your 20s or 30s, you should have a clinical breast exam (CBE) by a health care provider every 1-3 years as part of a regular health exam.  If you are 39 or older, have a CBE every year. Also consider having a breast X-ray (mammogram) every year.  If you have a family history of breast cancer, talk to your health care provider about genetic screening.  If you  are at high risk for breast cancer, talk to your health care provider about having an MRI and a mammogram every year.  Breast cancer gene (BRCA) assessment is recommended for women who have family members with BRCA-related cancers. BRCA-related cancers include:  Breast.  Ovarian.  Tubal.  Peritoneal cancers.  Results of the assessment will determine the need for genetic counseling and BRCA1 and BRCA2 testing. Cervical Cancer Your health care provider may recommend that you be screened regularly for cancer of the pelvic organs (ovaries, uterus, and  vagina). This screening involves a pelvic examination, including checking for microscopic changes to the surface of your cervix (Pap test). You may be encouraged to have this screening done every 3 years, beginning at age 21.  For women ages 30-65, health care providers may recommend pelvic exams and Pap testing every 3 years, or they may recommend the Pap and pelvic exam, combined with testing for human papilloma virus (HPV), every 5 years. Some types of HPV increase your risk of cervical cancer. Testing for HPV may also be done on women of any age with unclear Pap test results.  Other health care providers may not recommend any screening for nonpregnant women who are considered low risk for pelvic cancer and who do not have symptoms. Ask your health care provider if a screening pelvic exam is right for you.  If you have had past treatment for cervical cancer or a condition that could lead to cancer, you need Pap tests and screening for cancer for at least 20 years after your treatment. If Pap tests have been discontinued, your risk factors (such as having a new sexual partner) need to be reassessed to determine if screening should resume. Some women have medical problems that increase the chance of getting cervical cancer. In these cases, your health care provider may recommend more frequent screening and Pap tests. Colorectal Cancer  This type of cancer can be detected and often prevented.  Routine colorectal cancer screening usually begins at 47 years of age and continues through 47 years of age.  Your health care provider may recommend screening at an earlier age if you have risk factors for colon cancer.  Your health care provider may also recommend using home test kits to check for hidden blood in the stool.  A small camera at the end of a tube can be used to examine your colon directly (sigmoidoscopy or colonoscopy). This is done to check for the earliest forms of colorectal  cancer.  Routine screening usually begins at age 50.  Direct examination of the colon should be repeated every 5-10 years through 47 years of age. However, you may need to be screened more often if early forms of precancerous polyps or small growths are found. Skin Cancer  Check your skin from head to toe regularly.  Tell your health care provider about any new moles or changes in moles, especially if there is a change in a mole's shape or color.  Also tell your health care provider if you have a mole that is larger than the size of a pencil eraser.  Always use sunscreen. Apply sunscreen liberally and repeatedly throughout the day.  Protect yourself by wearing long sleeves, pants, a wide-brimmed hat, and sunglasses whenever you are outside. HEART DISEASE, DIABETES, AND HIGH BLOOD PRESSURE   High blood pressure causes heart disease and increases the risk of stroke. High blood pressure is more likely to develop in:  People who have blood pressure in the high end   of the normal range (130-139/85-89 mm Hg).  People who are overweight or obese.  People who are African American.  If you are 38-23 years of age, have your blood pressure checked every 3-5 years. If you are 61 years of age or older, have your blood pressure checked every year. You should have your blood pressure measured twice--once when you are at a hospital or clinic, and once when you are not at a hospital or clinic. Record the average of the two measurements. To check your blood pressure when you are not at a hospital or clinic, you can use:  An automated blood pressure machine at a pharmacy.  A home blood pressure monitor.  If you are between 45 years and 39 years old, ask your health care provider if you should take aspirin to prevent strokes.  Have regular diabetes screenings. This involves taking a blood sample to check your fasting blood sugar level.  If you are at a normal weight and have a low risk for diabetes,  have this test once every three years after 47 years of age.  If you are overweight and have a high risk for diabetes, consider being tested at a younger age or more often. PREVENTING INFECTION  Hepatitis B  If you have a higher risk for hepatitis B, you should be screened for this virus. You are considered at high risk for hepatitis B if:  You were born in a country where hepatitis B is common. Ask your health care provider which countries are considered high risk.  Your parents were born in a high-risk country, and you have not been immunized against hepatitis B (hepatitis B vaccine).  You have HIV or AIDS.  You use needles to inject street drugs.  You live with someone who has hepatitis B.  You have had sex with someone who has hepatitis B.  You get hemodialysis treatment.  You take certain medicines for conditions, including cancer, organ transplantation, and autoimmune conditions. Hepatitis C  Blood testing is recommended for:  Everyone born from [birth year] through 1967.  Anyone with known risk factors for hepatitis C. Sexually transmitted infections (STIs)  You should be screened for sexually transmitted infections (STIs) including gonorrhea and chlamydia if:  You are sexually active and are younger than 47 years of age.  You are older than 47 years of age and your health care provider tells you that you are at risk for this type of infection.  Your sexual activity has changed since you were last screened and you are at an increased risk for chlamydia or gonorrhea. Ask your health care provider if you are at risk.  If you do not have HIV, but are at risk, it may be recommended that you take a prescription medicine daily to prevent HIV infection. This is called pre-exposure prophylaxis (PrEP). You are considered at risk if:  You are sexually active and do not regularly use condoms or know the HIV status of your partner(s).  You take drugs by injection.  You are sexually  active with a partner who has HIV. Talk with your health care provider about whether you are at high risk of being infected with HIV. If you choose to begin PrEP, you should first be tested for HIV. You should then be tested every 3 months for as long as you are taking PrEP.  PREGNANCY   If you are premenopausal and you may become pregnant, ask your health care provider about preconception counseling.  If you may  become pregnant, take 400 to 800 micrograms (mcg) of folic acid every day.  If you want to prevent pregnancy, talk to your health care provider about birth control (contraception). OSTEOPOROSIS AND MENOPAUSE   Osteoporosis is a disease in which the bones lose minerals and strength with aging. This can result in serious bone fractures. Your risk for osteoporosis can be identified using a bone density scan.  If you are 61 years of age or older, or if you are at risk for osteoporosis and fractures, ask your health care provider if you should be screened.  Ask your health care provider whether you should take a calcium or vitamin D supplement to lower your risk for osteoporosis.  Menopause may have certain physical symptoms and risks.  Hormone replacement therapy may reduce some of these symptoms and risks. Talk to your health care provider about whether hormone replacement therapy is right for you.  HOME CARE INSTRUCTIONS   Schedule regular health, dental, and eye exams.  Stay current with your immunizations.   Do not use any tobacco products including cigarettes, chewing tobacco, or electronic cigarettes.  If you are pregnant, do not drink alcohol.  If you are breastfeeding, limit how much and how often you drink alcohol.  Limit alcohol intake to no more than 1 drink per day for nonpregnant women. One drink equals 12 ounces of beer, 5 ounces of wine, or 1 ounces of hard liquor.  Do not use street drugs.  Do not share needles.  Ask your health care provider for help if  you need support or information about quitting drugs.  Tell your health care provider if you often feel depressed.  Tell your health care provider if you have ever been abused or do not feel safe at home.   This information is not intended to replace advice given to you by your health care provider. Make sure you discuss any questions you have with your health care provider.   Document Released: 01/01/2011 Document Revised: 07/09/2014 Document Reviewed: 05/20/2013 Elsevier Interactive Patient Education [employer].

## 2015-07-08 NOTE — Progress Notes (Signed)
   Subjective:    Patient ID: Becky Moore, female    DOB: 1971/01/12, 45 y.o.   MRN: [id number]  HPI  45 year old patient who is seen today for a preventive health examination.  She had a full neurological evaluation last month after a possible left brain TIA.  She has had no recurrent symptoms. Laboratory studies reviewed She's had a mammogram and gynecologic evaluation.  She is seen by ophthalmology annually. No concerns or complaints .  Due to a likely TIA, she has been placed on aspirin therapy as well as statin therapy    Allergies (verified): No Known Drug Allergies  Past History:  Past Medical History: Reviewed history from 08/15/2009 and no changes required. G2 P1 abortion x 1 cervical dysplasia  GERD Depression  Past Surgical History: Cholecystectomy 11-2009 EGD 2011 colonoscopy 2011  Review of Systems  Constitutional: Negative for fever, appetite change, fatigue and unexpected weight change.  HENT: Negative for congestion, dental problem, ear pain, hearing loss, mouth sores, nosebleeds, sinus pressure, sore throat, tinnitus, trouble swallowing and voice change.   Eyes: Negative for photophobia, pain, redness and visual disturbance.  Respiratory: Negative for cough, chest tightness and shortness of breath.   Cardiovascular: Negative for chest pain, palpitations and leg swelling.  Gastrointestinal: Negative for nausea, vomiting, abdominal pain, diarrhea, constipation, blood in stool, abdominal distention and rectal pain.  Genitourinary: Negative for dysuria, urgency, frequency, hematuria, flank pain, vaginal bleeding, vaginal discharge, difficulty urinating, genital sores, vaginal pain, menstrual problem and pelvic pain.  Musculoskeletal: Negative for back pain, arthralgias and neck stiffness.  Skin: Negative for rash.  Neurological: Negative for dizziness, syncope, speech difficulty, weakness, light-headedness, numbness and headaches.  Hematological: Negative  for adenopathy. Does not bruise/bleed easily.  Psychiatric/Behavioral: Negative for suicidal ideas, behavioral problems, self-injury, dysphoric mood and agitation. The patient is not nervous/anxious.        Objective:   Physical Exam  Constitutional: She is oriented to person, place, and time. She appears well-developed and well-nourished.  HENT:  Head: Normocephalic.  Right Ear: External ear normal.  Left Ear: External ear normal.  Mouth/Throat: Oropharynx is clear and moist.  Eyes: Conjunctivae and EOM are normal. Pupils are equal, round, and reactive to light.  Neck: Normal range of motion. Neck supple. No thyromegaly present.  Cardiovascular: Normal rate, regular rhythm, normal heart sounds and intact distal pulses.   Pulmonary/Chest: Effort normal and breath sounds normal.  Abdominal: Soft. Bowel sounds are normal. She exhibits no mass. There is no tenderness.  Musculoskeletal: Normal range of motion.  Lymphadenopathy:    She has no cervical adenopathy.  Neurological: She is alert and oriented to person, place, and time.  Skin: Skin is warm and dry. No rash noted.  Psychiatric: She has a normal mood and affect. Her behavior is normal.          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  Continue daily aspirin and statin therapy per neurology recommendations.  We'll decrease atorvastatin to 20 mg daily dose  Recheck one year or as needed

## 2016-05-31 LAB — HM MAMMOGRAPHY: HM MAMMO: NORMAL (ref 0–4)

## 2016-06-07 ENCOUNTER — Encounter: Payer: Self-pay | Admitting: Internal Medicine

## 2016-09-25 ENCOUNTER — Other Ambulatory Visit: Payer: Self-pay

## 2016-10-01 ENCOUNTER — Ambulatory Visit (INDEPENDENT_AMBULATORY_CARE_PROVIDER_SITE_OTHER): Payer: BLUE CROSS/BLUE SHIELD | Admitting: Internal Medicine

## 2016-10-01 ENCOUNTER — Encounter: Payer: Self-pay | Admitting: Internal Medicine

## 2016-10-01 VITALS — BP 112/66 | HR 77 | Temp 98.0°F | Ht 65.25 in | Wt 201.4 lb

## 2016-10-01 DIAGNOSIS — Z86010 Personal history of colonic polyps: Secondary | ICD-10-CM | POA: Insufficient documentation

## 2016-10-01 DIAGNOSIS — E785 Hyperlipidemia, unspecified: Secondary | ICD-10-CM | POA: Diagnosis not present

## 2016-10-01 DIAGNOSIS — Z0000 Encounter for general adult medical examination without abnormal findings: Secondary | ICD-10-CM | POA: Diagnosis not present

## 2016-10-01 DIAGNOSIS — Z860101 Personal history of adenomatous and serrated colon polyps: Secondary | ICD-10-CM | POA: Insufficient documentation

## 2016-10-01 MED ORDER — FLUTICASONE PROPIONATE 50 MCG/ACT NA SUSP: 2.0000 | Freq: Every day | NASAL | 6 refills | Status: AC

## 2016-10-01 MED ORDER — VARENICLINE TARTRATE 0.5 MG X 11 & 1 MG X 42 PO MISC: ORAL | 0 refills | Status: DC

## 2016-10-01 MED ORDER — VARENICLINE TARTRATE 1 MG PO TABS: 1.0000 mg | ORAL_TABLET | Freq: Two times a day (BID) | ORAL | 2 refills | Status: DC

## 2016-10-01 NOTE — Progress Notes (Signed)
Subjective:    Patient ID: Becky Moore, female    DOB: 09-Feb-1971, 46 y.o.   MRN: [id number]  HPI  46 year old patient who is seen today for a preventive health exam. She has a history of colonic polyps.  Her last colonoscopy was 2011. Mammogram performed the November 2017. She is followed annually by gynecology.  She has a history of dyslipidemia and remains on atorvastatin.  No past medical history on file.   Social History   Social History  . Marital status: Single    Spouse name: N/A  . Number of children: N/A  . Years of education: N/A   Occupational History  . Not on file.   Social History Main Topics  . Smoking status: Former Smoker    Packs/day: 0.25    Types: Cigarettes    Quit date: 03/22/2015  . Smokeless tobacco: Never Used  . Alcohol use 0.0 oz/week     Comment: occ  . Drug use: No  . Sexual activity: Yes    Birth control/ protection: None   Other Topics Concern  . Not on file   Social History Narrative   Some college     Past Surgical History:  Procedure Laterality Date  . CHOLECYSTECTOMY    . HERNIA REPAIR      No family history on file.  No Known Allergies  Current Outpatient Prescriptions on File Prior to Visit  Medication Sig Dispense Refill  . acetaminophen (TYLENOL) 500 MG tablet Take 500 mg by mouth every 6 (six) hours as needed. Reported on 07/01/2015    . aspirin EC 81 MG tablet Take 1 tablet (81 mg total) by mouth daily.    Marland Kitchen atorvastatin (LIPITOR) 40 MG tablet Take 0.5 tablets (20 mg total) by mouth daily. 90 tablet 3  . chlorhexidine gluconate (PERIDEX) 0.12 % solution Use as directed 15 mLs in the mouth or throat 2 (two) times daily. 120 mL 0  . HYDROcodone-acetaminophen (NORCO) 5-325 MG tablet Take 1 tablet by mouth every 6 (six) hours as needed. 20 tablet 0  . Meclizine HCl 25 MG CHEW Chew 1 tablet (25 mg total) by mouth 4 (four) times daily as needed. 90 each 0   No current facility-administered medications on file  prior to visit.     BP 112/66 (BP Location: Left Arm, Patient Position: Sitting, Cuff Size: Normal)   Pulse 77   Temp 98 F (36.7 C) (Oral)   Ht 5' 5.25" (1.657 m)   Wt 201 lb 6.4 oz (91.4 kg)   SpO2 98%   BMI 33.26 kg/m     Review of Systems  Constitutional: Negative.   HENT: Positive for congestion, postnasal drip, rhinorrhea and sinus pressure. Negative for dental problem, hearing loss, sore throat and tinnitus.   Eyes: Negative for pain, discharge and visual disturbance.  Respiratory: Negative for cough and shortness of breath.   Cardiovascular: Negative for chest pain, palpitations and leg swelling.  Gastrointestinal: Negative for abdominal distention, abdominal pain, blood in stool, constipation, diarrhea, nausea and vomiting.  Genitourinary: Negative for difficulty urinating, dysuria, flank pain, frequency, hematuria, pelvic pain, urgency, vaginal bleeding, vaginal discharge and vaginal pain.  Musculoskeletal: Negative for arthralgias, gait problem and joint swelling.  Skin: Negative for rash.  Neurological: Negative for dizziness, syncope, speech difficulty, weakness, numbness and headaches.  Hematological: Negative for adenopathy.  Psychiatric/Behavioral: Negative for agitation, behavioral problems and dysphoric mood. The patient is not nervous/anxious.        Objective:   Physical  Exam  Constitutional: She is oriented to person, place, and time. She appears well-developed and well-nourished.  HENT:  Head: Normocephalic and atraumatic.  Right Ear: External ear normal.  Left Ear: External ear normal.  Mouth/Throat: Oropharynx is clear and moist.  Eyes: Conjunctivae and EOM are normal.  Neck: Normal range of motion. Neck supple. No JVD present. No thyromegaly present.  Cardiovascular: Normal rate, regular rhythm, normal heart sounds and intact distal pulses.   No murmur heard. Pulmonary/Chest: Effort normal and breath sounds normal. She has no wheezes. She has no  rales.  Abdominal: Soft. Bowel sounds are normal. She exhibits no distension and no mass. There is no tenderness. There is no rebound and no guarding.  Musculoskeletal: Normal range of motion. She exhibits no edema or tenderness.  Neurological: She is alert and oriented to person, place, and time. She has normal reflexes. No cranial nerve deficit. She exhibits normal muscle tone. Coordination normal.  Skin: Skin is warm and dry. No rash noted.  Psychiatric: She has a normal mood and affect. Her behavior is normal.          Assessment & Plan:   Preventive health care Dyslipidemia.  Continue statin therapy.  We'll review a lipid profile Allergic rhinitis.  Will give a trial of antihistamine and fluticasone nasal spray History colonic polyps.  Last colonoscopy March 2011.  Will schedule follow-up colonoscopy Tobacco abuse.  Will give a trial of Chantix  OB/GYN.  Follow-up Recheck here one year or as needed  [employer]

## 2016-10-01 NOTE — Patient Instructions (Addendum)
Fluticasone nasal spray daily Use a nonsedating antihistamine such as Allegra or Zyrtec once daily  Schedule your colonoscopy to help detect colon cancer.  OB/GYN.  Follow-up  Smoking tobacco is very bad for your health. You should stop smoking immediately.  Return in one year for follow-up

## 2016-10-01 NOTE — Progress Notes (Signed)
Pre visit review using our clinic review tool, if applicable. No additional management support is needed unless otherwise documented below in the visit note. 

## 2016-10-02 LAB — COMPREHENSIVE METABOLIC PANEL
ALK PHOS: 66 U/L (ref 39–117)
ALT: 15 U/L (ref 0–35)
AST: 17 U/L (ref 0–37)
Albumin: 4.3 g/dL (ref 3.5–5.2)
BUN: 8 mg/dL (ref 6–23)
CHLORIDE: 102 meq/L (ref 96–112)
CO2: 28 mEq/L (ref 19–32)
Calcium: 9.3 mg/dL (ref 8.4–10.5)
Creatinine, Ser: 0.79 mg/dL (ref 0.40–1.20)
GFR: 99.81 mL/min (ref >60.00)
GLUCOSE: 77 mg/dL (ref 70–99)
POTASSIUM: 3.8 meq/L (ref 3.5–5.1)
Sodium: 138 mEq/L (ref 135–145)
Total Bilirubin: 0.4 mg/dL (ref 0.2–1.2)
Total Protein: 7.1 g/dL (ref 6.0–8.3)

## 2016-10-02 LAB — CBC WITH DIFFERENTIAL/PLATELET
Basophils Absolute: 0.1 10*3/uL (ref 0.0–0.1)
Basophils Relative: 0.5 % (ref 0.0–3.0)
EOS PCT: 2.1 % (ref 0.0–5.0)
Eosinophils Absolute: 0.3 10*3/uL (ref 0.0–0.7)
HCT: 40.2 % (ref 36.0–46.0)
Hemoglobin: 13.4 g/dL (ref 12.0–15.0)
LYMPHS ABS: 4.2 10*3/uL — AB (ref 0.7–4.0)
Lymphocytes Relative: 33.3 % (ref 12.0–46.0)
MCHC: 33.2 g/dL (ref 30.0–36.0)
MCV: 91.7 fl (ref 78.0–100.0)
MONOS PCT: 10.1 % (ref 3.0–12.0)
Monocytes Absolute: 1.3 10*3/uL — ABNORMAL HIGH (ref 0.1–1.0)
NEUTROS PCT: 54 % (ref 43.0–77.0)
Neutro Abs: 6.9 10*3/uL (ref 1.4–7.7)
PLATELETS: 267 10*3/uL (ref 150.0–400.0)
RBC: 4.39 Mil/uL (ref 3.87–5.11)
RDW: 14.4 % (ref 11.5–15.5)
WBC: 12.8 10*3/uL — ABNORMAL HIGH (ref 4.0–10.5)

## 2016-10-02 LAB — LIPID PANEL
CHOL/HDL RATIO: 4
Cholesterol: 196 mg/dL (ref 0–200)
HDL: 49.8 mg/dL (ref >39.00)
LDL Cholesterol: 124 mg/dL — ABNORMAL HIGH (ref 0–99)
NONHDL: 146.36
Triglycerides: 114 mg/dL (ref 0.0–149.0)
VLDL: 22.8 mg/dL (ref 0.0–40.0)

## 2016-10-02 LAB — TSH: TSH: 3.15 u[IU]/mL (ref 0.35–4.50)

## 2016-10-05 ENCOUNTER — Telehealth: Payer: Self-pay | Admitting: Internal Medicine

## 2016-10-05 NOTE — Telephone Encounter (Signed)
Pt need new Rx for meclizine  Pharm:  [facility].  And would like to have a call once this is called in to the pharmacy would like to have this before the weekend.   Pt state that she is having flare ups of her sciatica 3 times a week monthly being out up to 3 days and would like to see if this could be reflected on her FMLA form that she picked up from the office on 10/04/16.

## 2016-10-08 ENCOUNTER — Other Ambulatory Visit: Payer: Self-pay | Admitting: Internal Medicine

## 2016-10-08 MED ORDER — MECLIZINE HCL 25 MG PO CHEW: 1.0000 | CHEWABLE_TABLET | Freq: Four times a day (QID) | ORAL | 0 refills | Status: DC | PRN

## 2016-10-12 MED ORDER — PROMETHAZINE HCL 25 MG PO TABS: 25.0000 mg | ORAL_TABLET | Freq: Four times a day (QID) | ORAL | 0 refills | Status: DC | PRN

## 2016-10-12 NOTE — Telephone Encounter (Signed)
Pt would like to see if there is something different for the Rx meclizine the one that was called in to the pharmacy is $49 and it was something for motion sickness OTC and what she has is more than motion sickness.  She would like to see if what can be called in is the same that she had previously so that the insurance will pay for it.

## 2016-10-12 NOTE — Telephone Encounter (Signed)
Please see massage below, please advise

## 2016-10-12 NOTE — Telephone Encounter (Signed)
Generic Phenergan 25 mg #40 one every 6 hours as needed f

## 2016-10-12 NOTE — Telephone Encounter (Signed)
Pts amended FMLA pw was refaxed and a copy is left for her to pick up.

## 2016-10-12 NOTE — Telephone Encounter (Signed)
Medication was called in to pt's pharmacy.

## 2016-10-26 ENCOUNTER — Telehealth: Payer: Self-pay | Admitting: Internal Medicine

## 2016-10-26 NOTE — Telephone Encounter (Signed)
Pt would like to see if Dr. Raliegh Ip would approve for her to stop wearing the heavy steel toe shoes and write her a note for her to wear cover ups on her regular shoes the aren't so heavy.

## 2016-10-29 NOTE — Telephone Encounter (Signed)
Please notify patient that I have no medical diagnosis or or compelling reason to justify changing job related regulations

## 2016-10-29 NOTE — Telephone Encounter (Signed)
See message below, please advise.

## 2016-10-29 NOTE — Telephone Encounter (Signed)
Informed that Dr Raliegh Ip  has no medical diagnosis or or compelling reason to justify changing job related regulations. Pt verbalized understanding

## 2016-10-30 ENCOUNTER — Ambulatory Visit (AMBULATORY_SURGERY_CENTER): Payer: Self-pay

## 2016-10-30 VITALS — Ht 65.0 in | Wt 202.8 lb

## 2016-10-30 DIAGNOSIS — Z860100 Personal history of colon polyps, unspecified: Secondary | ICD-10-CM

## 2016-10-30 MED ORDER — SUPREP BOWEL PREP KIT 17.5-3.13-1.6 GM/177ML PO SOLN: 1.0000 | Freq: Once | ORAL | 0 refills | Status: AC

## 2016-10-30 NOTE — Progress Notes (Signed)
No allergies to eggs or soy No past problems with anesthesia No home oxygen No diet meds  Registered emmi 

## 2016-11-14 ENCOUNTER — Ambulatory Visit (AMBULATORY_SURGERY_CENTER): Payer: BLUE CROSS/BLUE SHIELD | Admitting: Gastroenterology

## 2016-11-14 ENCOUNTER — Encounter: Payer: Self-pay | Admitting: Gastroenterology

## 2016-11-14 VITALS — BP 122/76 | HR 59 | Temp 97.3°F | Resp 11 | Ht 65.0 in | Wt 202.0 lb

## 2016-11-14 DIAGNOSIS — Z86010 Personal history of colonic polyps: Secondary | ICD-10-CM | POA: Diagnosis present

## 2016-11-14 DIAGNOSIS — K635 Polyp of colon: Secondary | ICD-10-CM | POA: Diagnosis not present

## 2016-11-14 DIAGNOSIS — D123 Benign neoplasm of transverse colon: Secondary | ICD-10-CM | POA: Diagnosis not present

## 2016-11-14 DIAGNOSIS — D127 Benign neoplasm of rectosigmoid junction: Secondary | ICD-10-CM

## 2016-11-14 DIAGNOSIS — D125 Benign neoplasm of sigmoid colon: Secondary | ICD-10-CM

## 2016-11-14 MED ORDER — SODIUM CHLORIDE 0.9 % IV SOLN: 500.0000 mL | INTRAVENOUS | Status: DC

## 2016-11-14 NOTE — Progress Notes (Signed)
Called to room to assist during endoscopic procedure.  Patient ID and intended procedure confirmed with present staff. Received instructions for my participation in the procedure from the performing physician.  

## 2016-11-14 NOTE — Patient Instructions (Signed)
YOU HAD AN ENDOSCOPIC PROCEDURE TODAY AT [facility]:   Refer to the procedure report that was given to you for any specific questions about what was found during the examination.  If the procedure report does not answer your questions, please call your gastroenterologist to clarify.  If you requested that your care partner not be given the details of your procedure findings, then the procedure report has been included in a sealed envelope for you to review at your convenience later.  YOU SHOULD EXPECT: Some feelings of bloating in the abdomen. Passage of more gas than usual.  Walking can help get rid of the air that was put into your GI tract during the procedure and reduce the bloating. If you had a lower endoscopy (such as a colonoscopy or flexible sigmoidoscopy) you may notice spotting of blood in your stool or on the toilet paper. If you underwent a bowel prep for your procedure, you may not have a normal bowel movement for a few days.  Please Note:  You might notice some irritation and congestion in your nose or some drainage.  This is from the oxygen used during your procedure.  There is no need for concern and it should clear up in a day or so.  SYMPTOMS TO REPORT IMMEDIATELY:   Following lower endoscopy (colonoscopy or flexible sigmoidoscopy):  Excessive amounts of blood in the stool  Significant tenderness or worsening of abdominal pains  Swelling of the abdomen that is new, acute  Fever of 100F or higher   For urgent or emergent issues, a gastroenterologist can be reached at any hour by calling [phone].   DIET:  We do recommend a small meal at first, but then you may proceed to your regular diet.  Drink plenty of fluids but you should avoid alcoholic beverages for 24 hours.  ACTIVITY:  You should plan to take it easy for the rest of today and you should NOT DRIVE or use heavy machinery until tomorrow (because of the sedation medicines used during the test).     FOLLOW UP: Our staff will call the number listed on your records the next business day following your procedure to check on you and address any questions or concerns that you may have regarding the information given to you following your procedure. If we do not reach you, we will leave a message.  However, if you are feeling well and you are not experiencing any problems, there is no need to return our call.  We will assume that you have returned to your regular daily activities without incident.  If any biopsies were taken you will be contacted by phone or by letter within the next 1-3 weeks.  Please call us at [phone] if you have not heard about the biopsies in 3 weeks.    SIGNATURES/CONFIDENTIALITY: You and/or your care partner have signed paperwork which will be entered into your electronic medical record.  These signatures attest to the fact that that the information above on your After Visit Summary has been reviewed and is understood.  Full responsibility of the confidentiality of this discharge information lies with you and/or your care-partner.  Polyp, diverticulosis and heorrhoid information given.

## 2016-11-14 NOTE — Progress Notes (Signed)
A and O x3. Report to RN. Tolerated MAC anesthesia well.

## 2016-11-14 NOTE — Op Note (Addendum)
[facility] Patient Name: Becky Moore Procedure Date: 11/14/2016 7:27 AM MRN: [id number] Endoscopist: Mauri Pole , MD Age: 46 Referring MD:  Date of Birth: 09/12/1970 Gender: Female Account #: [id number] Procedure:                Colonoscopy Indications:              High risk colon cancer surveillance: Personal                            history of colonic polyps Medicines:                Monitored Anesthesia Care Procedure:                Pre-Anesthesia Assessment:                           - Prior to the procedure, a History and Physical                            was performed, and patient medications and                            allergies were reviewed. The patient's tolerance of                            previous anesthesia was also reviewed. The risks                            and benefits of the procedure and the sedation                            options and risks were discussed with the patient.                            All questions were answered, and informed consent                            was obtained. Prior Anticoagulants: The patient has                            taken no previous anticoagulant or antiplatelet                            agents. ASA Grade Assessment: II - A patient with                            mild systemic disease. After reviewing the risks                            and benefits, the patient was deemed in                            satisfactory condition to undergo the procedure.  After obtaining informed consent, the colonoscope                            was passed under direct vision. Throughout the                            procedure, the patient's blood pressure, pulse, and                            oxygen saturations were monitored continuously. The                            Colonoscope was introduced through the anus and                            advanced to the the terminal  ileum, with                            identification of the appendiceal orifice and IC                            valve. The colonoscopy was performed without                            difficulty. The patient tolerated the procedure                            well. The quality of the bowel preparation was                            excellent. The terminal ileum, ileocecal valve,                            appendiceal orifice, and rectum were photographed. Scope In: 8:15:17 AM Scope Out: 8:35:49 AM Scope Withdrawal Time: 0 hours 16 minutes 21 seconds  Total Procedure Duration: 0 hours 20 minutes 32 seconds  Findings:                 The perianal and digital rectal examinations were                            normal.                           Three sessile polyps were found in the sigmoid                            colon and transverse colon. The polyps were 4 to 5                            mm in size. These polyps were removed with a cold                            snare. Resection and retrieval were complete.  A 10 mm polyp was found in the transverse colon.                            The polyp was sessile. The polyp was removed with a                            cold snare. Resection and retrieval were complete.                            Coagulation for hemostasis of bleeding (small                            amount of oozing) caused by the polypectomy using                            monopolar probe with snare tip was successful.                            Estimated blood loss was minimal.                           A 3 mm polyp was found in the recto-sigmoid colon.                            The polyp was sessile. The polyp was removed with a                            cold biopsy forceps. Resection and retrieval were                            complete.                           A few small and large-mouthed diverticula were                            found  in the sigmoid colon, ascending colon and                            cecum.                           Non-bleeding internal hemorrhoids were found during                            retroflexion. The hemorrhoids were small. Complications:            No immediate complications. Estimated Blood Loss:     Estimated blood loss was minimal. Impression:               - Three 4 to 5 mm polyps in the sigmoid colon and                            in the transverse colon, removed with a cold  snare.                            Resected and retrieved.                           - One 10 mm polyp in the transverse colon, removed                            with a cold snare. Resected and retrieved. Treated                            with a monopolar probe.                           - One 3 mm polyp at the recto-sigmoid colon,                            removed with a cold biopsy forceps. Resected and                            retrieved.                           - Diverticulosis in the sigmoid colon, in the                            ascending colon and in the cecum.                           - Non-bleeding internal hemorrhoids. Recommendation:           - Patient has a contact number available for                            emergencies. The signs and symptoms of potential                            delayed complications were discussed with the                            patient. Return to normal activities tomorrow.                            Written discharge instructions were provided to the                            patient.                           - Resume previous diet.                           - Continue present medications.                           - Await pathology results.                           -  Repeat colonoscopy in 3 - 5 years for                            surveillance based on pathology results. Mauri Pole, MD 11/14/2016 8:44:31 AM This report has been signed  electronically.

## 2016-11-15 ENCOUNTER — Telehealth: Payer: Self-pay | Admitting: *Deleted

## 2016-11-15 ENCOUNTER — Telehealth: Payer: Self-pay

## 2016-11-15 NOTE — Telephone Encounter (Signed)
  Follow up Call-  Call back number 11/14/2016  Post procedure Call Back phone  # [phone]  Permission to leave phone message Yes  Some recent data might be hidden     Lm to return call if issues - questions- problems- marie mccraw rn

## 2016-11-15 NOTE — Telephone Encounter (Signed)
   Follow up Call-  Call back number 11/14/2016  Post procedure Call Back phone  # [phone]  Permission to leave phone message Yes  Some recent data might be hidden     Left message

## 2016-11-16 ENCOUNTER — Encounter: Payer: Self-pay | Admitting: Internal Medicine

## 2016-11-16 ENCOUNTER — Ambulatory Visit (INDEPENDENT_AMBULATORY_CARE_PROVIDER_SITE_OTHER): Payer: BLUE CROSS/BLUE SHIELD | Admitting: Internal Medicine

## 2016-11-16 VITALS — BP 120/68 | HR 71 | Temp 98.1°F | Ht 65.0 in | Wt 201.6 lb

## 2016-11-16 DIAGNOSIS — K219 Gastro-esophageal reflux disease without esophagitis: Secondary | ICD-10-CM

## 2016-11-16 DIAGNOSIS — G8929 Other chronic pain: Secondary | ICD-10-CM

## 2016-11-16 DIAGNOSIS — M549 Dorsalgia, unspecified: Secondary | ICD-10-CM | POA: Diagnosis not present

## 2016-11-16 DIAGNOSIS — E785 Hyperlipidemia, unspecified: Secondary | ICD-10-CM

## 2016-11-16 NOTE — Patient Instructions (Signed)
Call or return to clinic prn if these symptoms worsen or fail to improve as anticipated.

## 2016-11-17 ENCOUNTER — Encounter: Payer: Self-pay | Admitting: Internal Medicine

## 2016-11-17 NOTE — Progress Notes (Signed)
Subjective:    Patient ID: Becky Moore, female    DOB: 12/13/1970, 46 y.o.   MRN: [id number]  HPI  46 year old patient who is seen today for follow-up.  She has a history of dyslipidemia. She states also that she has a history of scoliosis as well as intermittent low back pain.  She states that she is working 7 days per week and often 12 hours on her feet.  She complains of worsening low back pain.  She is requesting a note for work to substitute steel toed slippers rather than maintain her steel toe shoes, which she feels are very heavy and aggravates her low back pain. She is also requesting revision of FMLA paperwork to enable more frequent absences from work for low back pain.  Past Medical History:  Diagnosis Date  . GERD (gastroesophageal reflux disease)   . Hyperlipidemia   . Vertigo      Social History   Social History  . Marital status: Single    Spouse name: N/A  . Number of children: N/A  . Years of education: N/A   Occupational History  . Not on file.   Social History Main Topics  . Smoking status: Former Smoker    Packs/day: 0.25    Types: Cigarettes    Quit date: 03/22/2015  . Smokeless tobacco: Never Used     Comment: on Chantix x 1 month  . Alcohol use 0.0 oz/week     Comment: once a month  . Drug use: No  . Sexual activity: Yes    Birth control/ protection: None   Other Topics Concern  . Not on file   Social History Narrative   Some college     Past Surgical History:  Procedure Laterality Date  . CHOLECYSTECTOMY    . HERNIA REPAIR      Family History  Problem Relation Age of Onset  . Colon cancer Neg Hx     No Known Allergies  Current Outpatient Prescriptions on File Prior to Visit  Medication Sig Dispense Refill  . fluticasone (FLONASE) 50 MCG/ACT nasal spray Place 2 sprays into both nostrils daily. 16 g 6  . Meclizine HCl 25 MG CHEW Chew 1 tablet (25 mg total) by mouth 4 (four) times daily as needed. 90 each 0  . promethazine  (PHENERGAN) 25 MG tablet Take 1 tablet (25 mg total) by mouth every 6 (six) hours as needed for nausea or vomiting. 40 tablet 0  . varenicline (CHANTIX CONTINUING MONTH PAK) 1 MG tablet Take 1 tablet (1 mg total) by mouth 2 (two) times daily. 60 tablet 2   No current facility-administered medications on file prior to visit.     BP 120/68 (BP Location: Left Arm, Patient Position: Sitting, Cuff Size: Normal)   Pulse 71   Temp 98.1 F (36.7 C) (Oral)   Ht 5\' 5"  (1.651 m)   Wt 201 lb 9.6 oz (91.4 kg)   LMP 10/26/2016   SpO2 98%   BMI 33.55 kg/m     Review of Systems  Constitutional: Negative.   HENT: Negative for congestion, dental problem, hearing loss, rhinorrhea, sinus pressure, sore throat and tinnitus.   Eyes: Negative for pain, discharge and visual disturbance.  Respiratory: Negative for cough and shortness of breath.   Cardiovascular: Negative for chest pain, palpitations and leg swelling.  Gastrointestinal: Negative for abdominal distention, abdominal pain, blood in stool, constipation, diarrhea, nausea and vomiting.  Genitourinary: Negative for difficulty urinating, dysuria, flank pain, frequency, hematuria,  pelvic pain, urgency, vaginal bleeding, vaginal discharge and vaginal pain.  Musculoskeletal: Positive for back pain. Negative for arthralgias, gait problem and joint swelling.  Skin: Negative for rash.  Neurological: Negative for dizziness, syncope, speech difficulty, weakness, numbness and headaches.  Hematological: Negative for adenopathy.  Psychiatric/Behavioral: Negative for agitation, behavioral problems and dysphoric mood. The patient is not nervous/anxious.        Objective:   Physical Exam  Constitutional: She is oriented to person, place, and time. She appears well-developed and well-nourished.  HENT:  Head: Normocephalic.  Right Ear: External ear normal.  Left Ear: External ear normal.  Mouth/Throat: Oropharynx is clear and moist.  Eyes: Conjunctivae and  EOM are normal. Pupils are equal, round, and reactive to light.  Neck: Normal range of motion. Neck supple. No thyromegaly present.  Cardiovascular: Normal rate, regular rhythm, normal heart sounds and intact distal pulses.   Pulmonary/Chest: Effort normal and breath sounds normal.  Abdominal: Soft. Bowel sounds are normal. She exhibits no mass. There is no tenderness.  Musculoskeletal: Normal range of motion.  Lymphadenopathy:    She has no cervical adenopathy.  Neurological: She is alert and oriented to person, place, and time.  Skin: Skin is warm and dry. No rash noted.  Psychiatric: She has a normal mood and affect. Her behavior is normal.          Assessment & Plan:   Low back pain.  Aggravated by long workweek.  Will write a letter on her behalf suggesting the use of steel toed slippers.  Perhaps the use of better quality footwear with better arch support and cushioning will be helpful Dyslipidemia.  Continue statin therapy  Nyoka Cowden

## 2016-11-19 ENCOUNTER — Telehealth: Payer: Self-pay | Admitting: Internal Medicine

## 2016-11-19 NOTE — Telephone Encounter (Signed)
Pt was seen on 5-18 and calling back checking on status of revise FMLA form

## 2016-11-20 ENCOUNTER — Encounter: Payer: Self-pay | Admitting: Gastroenterology

## 2016-11-20 NOTE — Telephone Encounter (Signed)
I do not have the form for this pt.

## 2016-11-22 NOTE — Telephone Encounter (Signed)
Pt stated the she give Dr Raliegh Ip the form.  Please advise

## 2016-11-22 NOTE — Telephone Encounter (Signed)
No, we discussed revising the FMLA form, but did not receive from the patient the prior or a new form for completion

## 2017-01-21 IMAGING — CR DG ANKLE COMPLETE 3+V*L*
3 series · 3 of 3 positions shown · non-contrast
Comparison: None.

CLINICAL DATA: Patient reports she "gashed" her left leg open while
at work last night. Lateral left ankle is currently covered with
gauze. Unable to remove due to bleeding. Swelling noted around left
lateral malleolus of left ankle.

EXAM:
LEFT ANKLE COMPLETE - 3+ VIEW

[x ankle ap left]
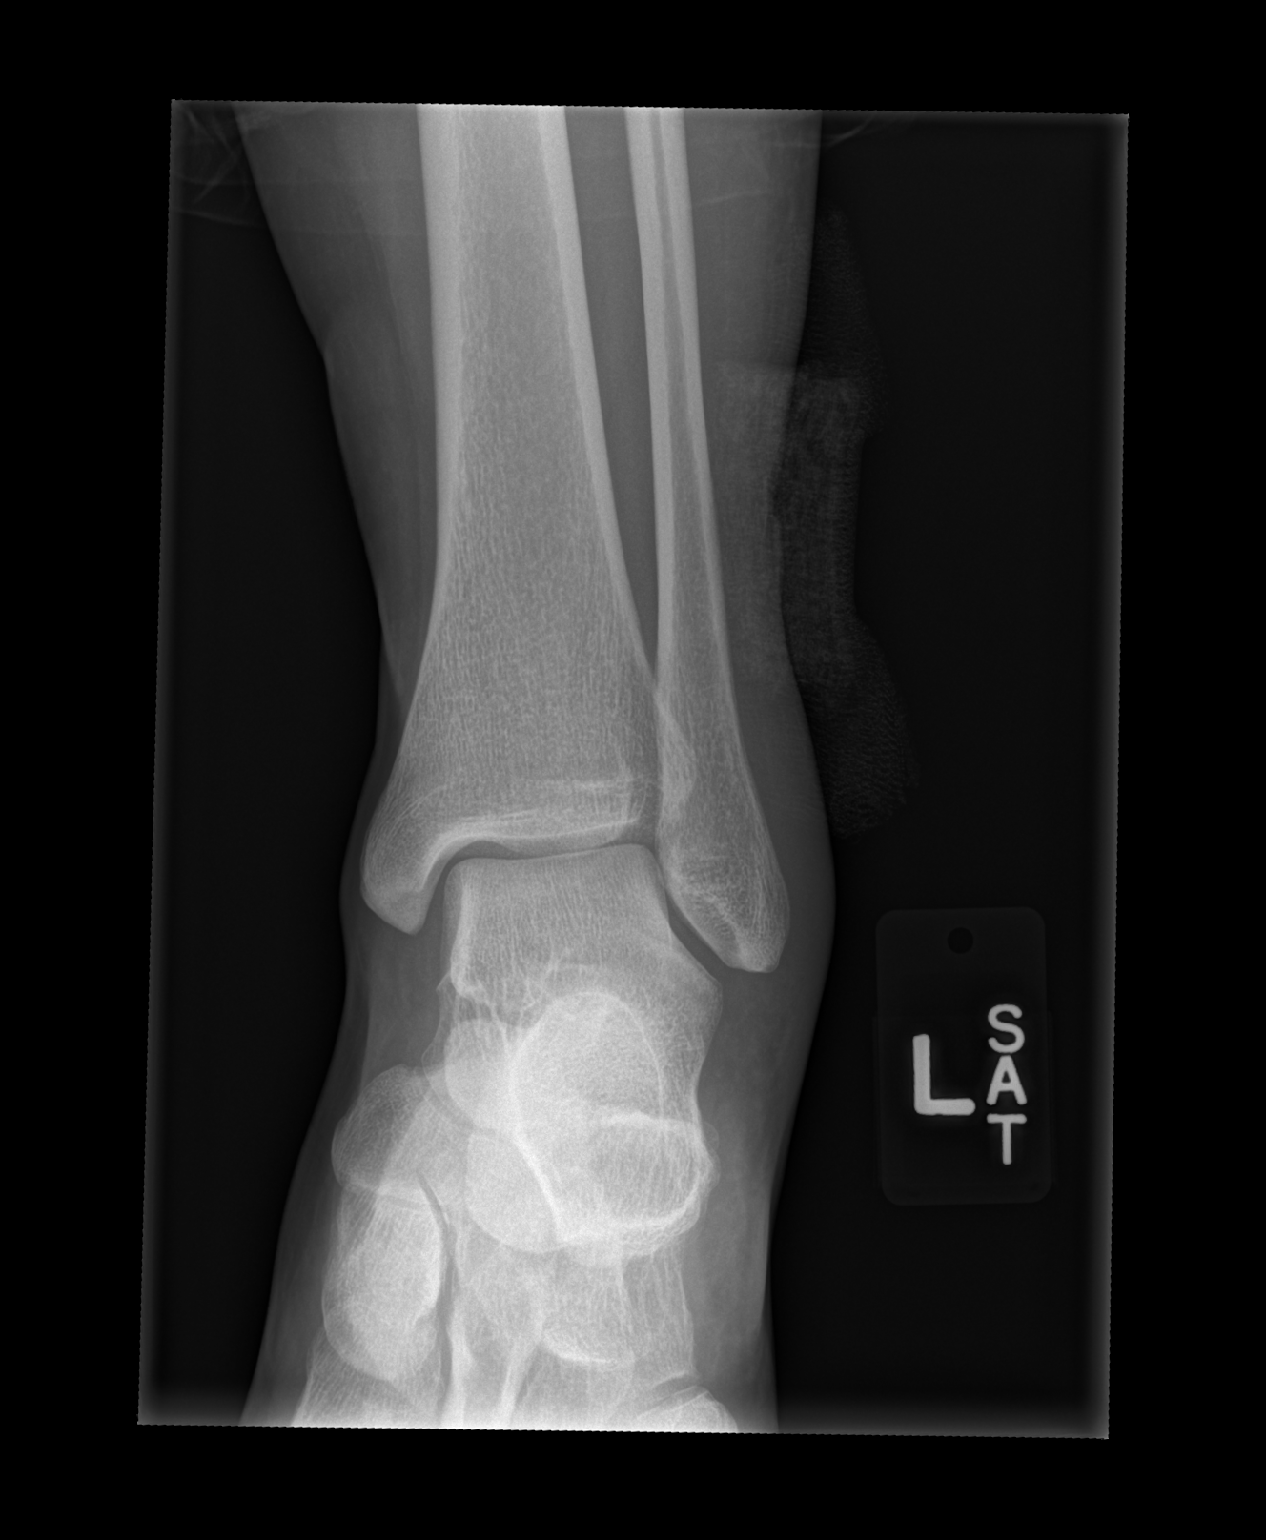

[x ankle obl left]
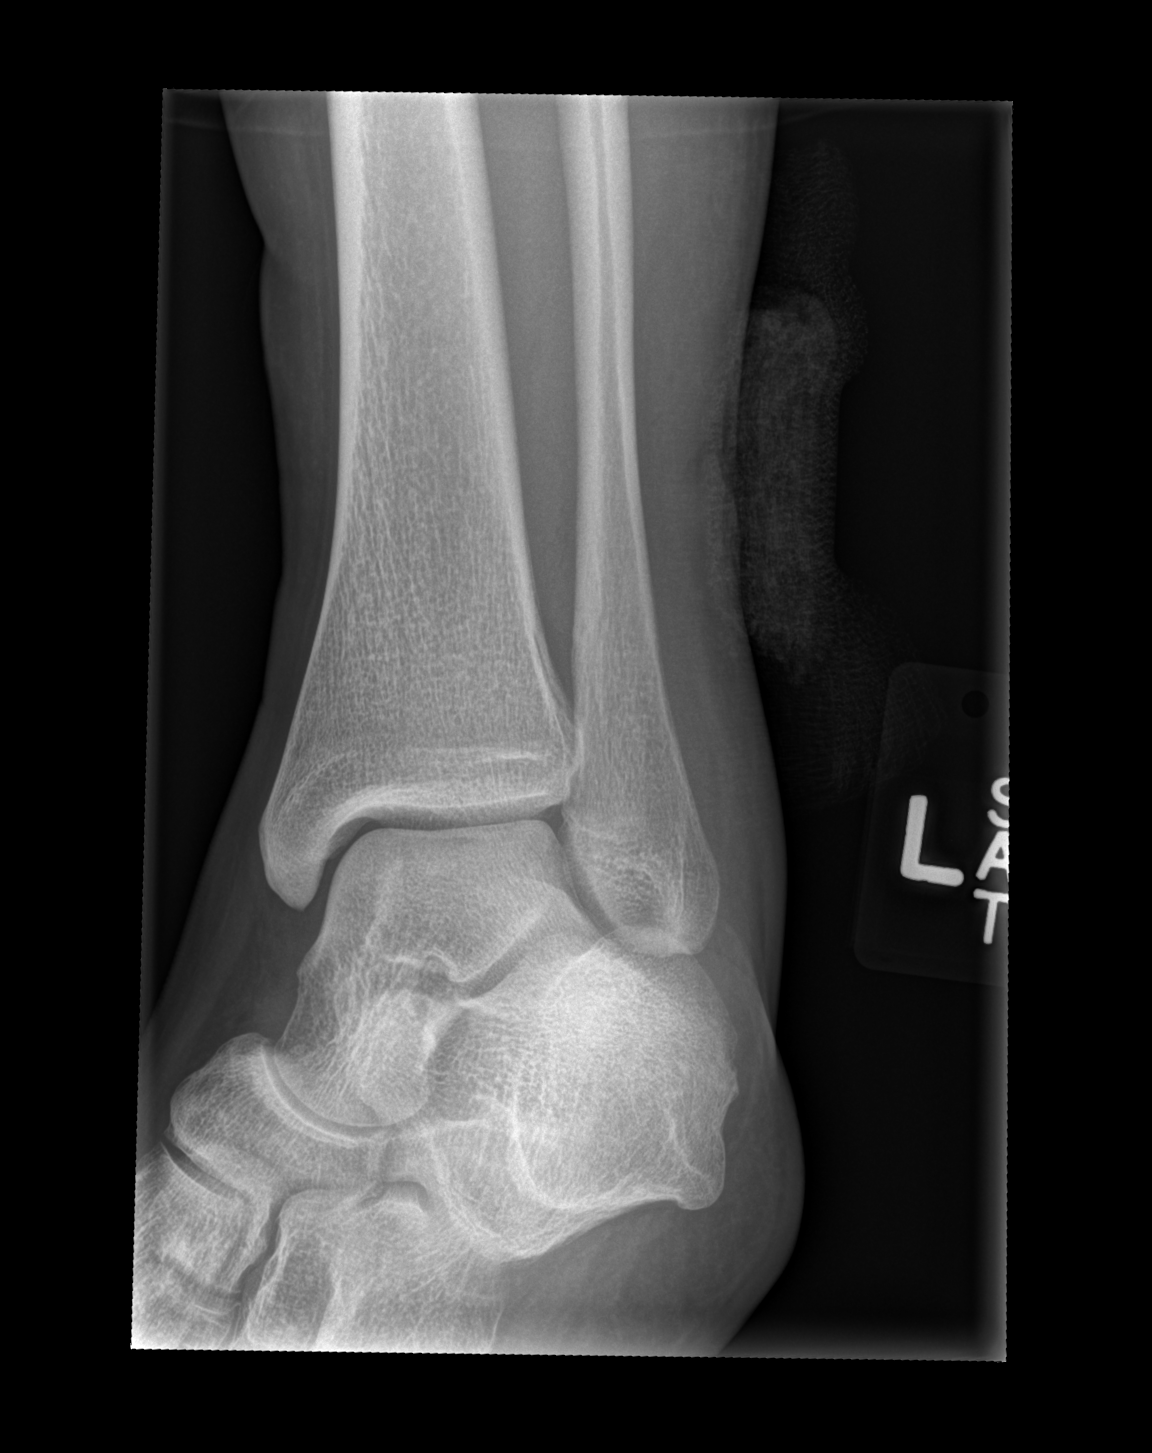

[x ankle lat left]
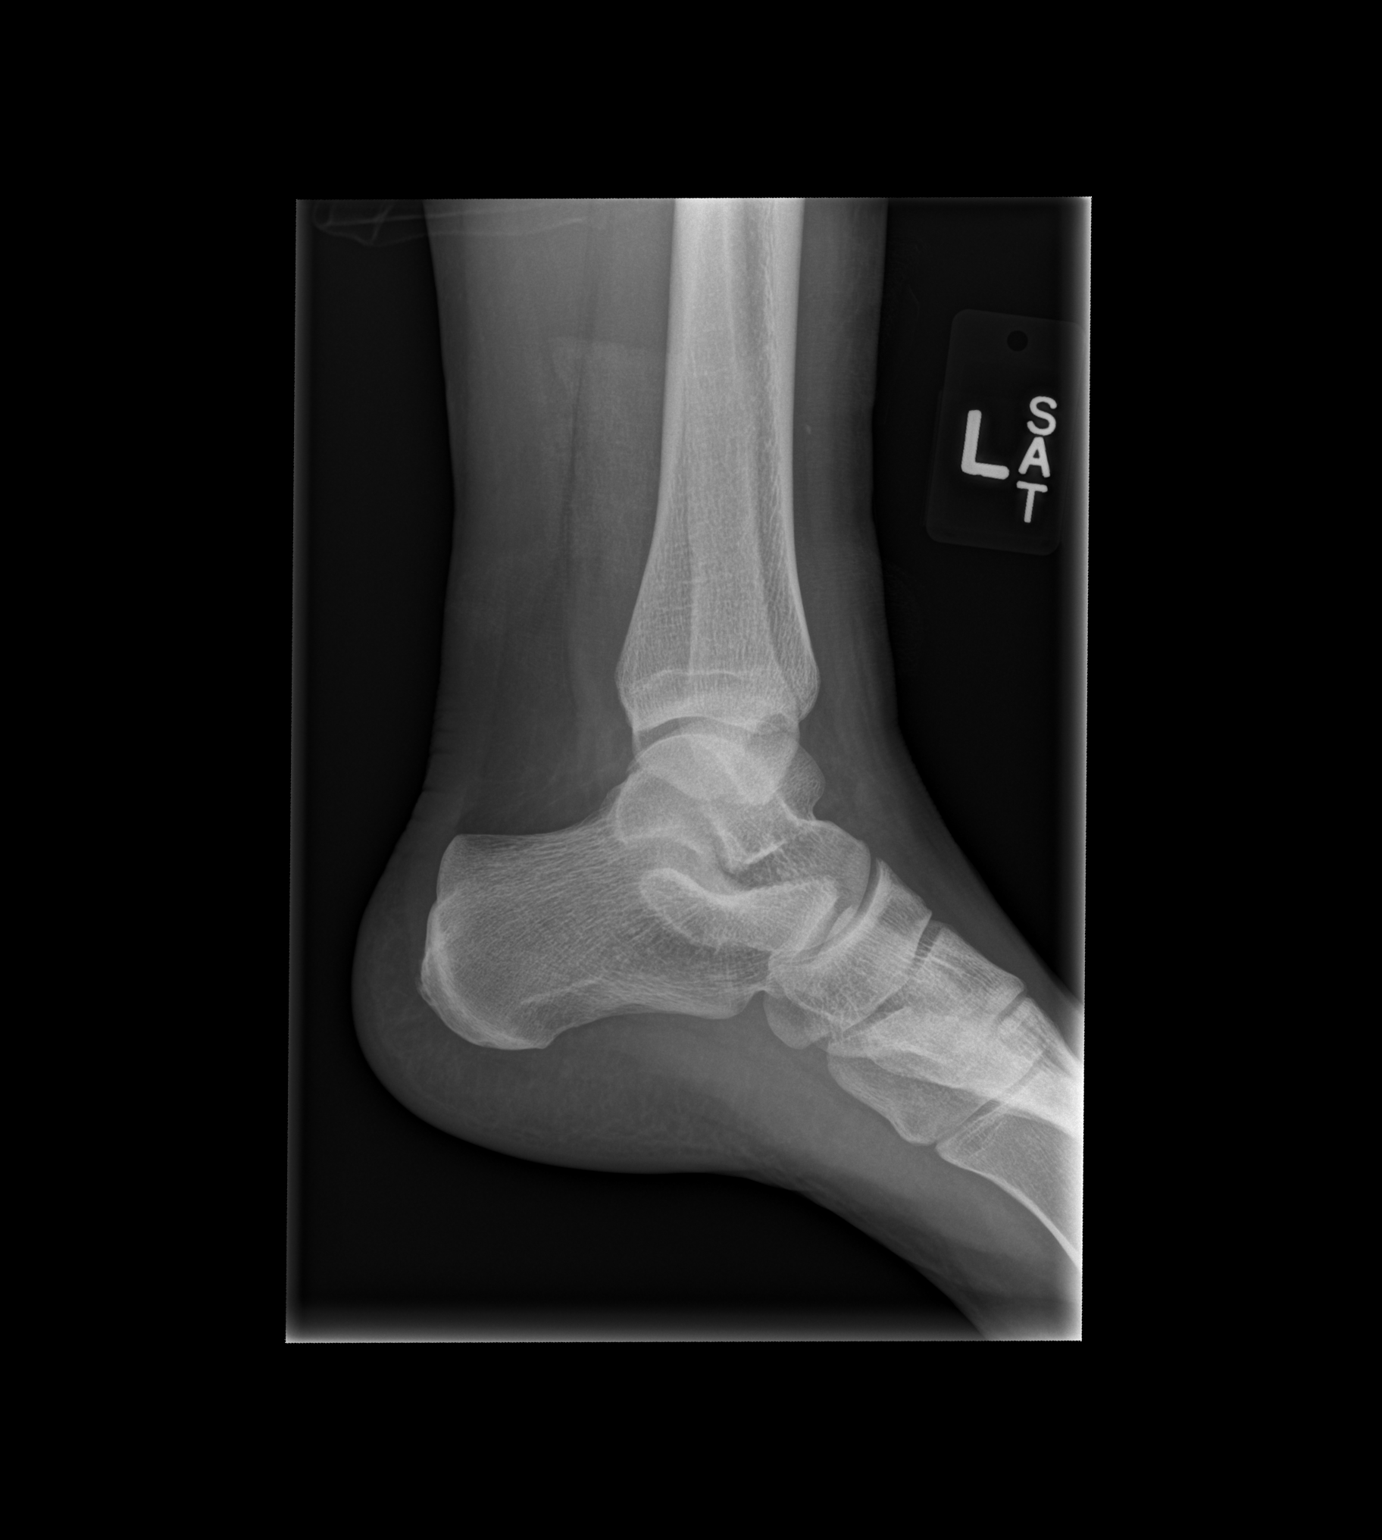

[3 of 3 positions shown; findings below may reference images not displayed]

FINDINGS: No fracture. Ankle mortise normally spaced and aligned. No
arthropathic change.

There is lateral soft tissue swelling. Soft tissue defect is noted
laterally above the ankle joint level. There is no radiopaque
foreign body.
IMPRESSION: No fracture dislocation.  No radiopaque foreign body.

## 2017-03-21 ENCOUNTER — Encounter: Payer: Self-pay | Admitting: Internal Medicine

## 2017-04-09 ENCOUNTER — Ambulatory Visit (INDEPENDENT_AMBULATORY_CARE_PROVIDER_SITE_OTHER): Payer: BLUE CROSS/BLUE SHIELD | Admitting: Internal Medicine

## 2017-04-09 ENCOUNTER — Encounter: Payer: Self-pay | Admitting: Internal Medicine

## 2017-04-09 VITALS — BP 138/72 | HR 71 | Temp 98.0°F | Ht 65.0 in | Wt 198.8 lb

## 2017-04-09 DIAGNOSIS — M25511 Pain in right shoulder: Secondary | ICD-10-CM | POA: Diagnosis not present

## 2017-04-09 MED ORDER — METHYLPREDNISOLONE ACETATE 80 MG/ML IJ SUSP
80.0000 mg | Freq: Once | INTRAMUSCULAR | Status: AC
  Administered 2017-04-09: 80 mg via INTRAMUSCULAR

## 2017-04-09 NOTE — Patient Instructions (Addendum)
Shoulder Impingement Syndrome Rehab Ask your health care provider which exercises are safe for you. Do exercises exactly as told by your health care provider and adjust them as directed. It is normal to feel mild stretching, pulling, tightness, or discomfort as you do these exercises, but you should stop right away if you feel sudden pain or your pain gets worse.Do not begin these exercises until told by your health care provider. Stretching and range of motion exercise This exercise warms up your muscles and joints and improves the movement and flexibility of your shoulder. This exercise also helps to relieve pain and stiffness. Exercise A: Passive horizontal adduction  1. Sit or stand and pull your left / right elbow across your chest, toward your other shoulder. Stop when you feel a gentle stretch in the back of your shoulder and upper arm. ? Keep your arm at shoulder height. ? Keep your arm as close to your body as you comfortably can. 2. Hold for __________ seconds. 3. Slowly return to the starting position. Repeat __________ times. Complete this exercise __________ times a day. Strengthening exercises These exercises build strength and endurance in your shoulder. Endurance is the ability to use your muscles for a long time, even after they get tired. Exercise B: External rotation, isometric 1. Stand or sit in a doorway, facing the door frame. 2. Bend your left / right elbow and place the back of your wrist against the door frame. Only your wrist should be touching the frame. Keep your upper arm at your side. 3. Gently press your wrist against the door frame, as if you are trying to push your arm away from your abdomen. ? Avoid shrugging your shoulder while you press your hand against the door frame. Keep your shoulder blade tucked down toward the middle of your back. 4. Hold for __________ seconds. 5. Slowly release the tension, and relax your muscles completely before you do the exercise  again. Repeat __________ times. Complete this exercise __________ times a day. Exercise C: Internal rotation, isometric  1. Stand or sit in a doorway, facing the door frame. 2. Bend your left / right elbow and place the inside of your wrist against the door frame. Only your wrist should be touching the frame. Keep your upper arm at your side. 3. Gently press your wrist against the door frame, as if you are trying to push your arm toward your abdomen. ? Avoid shrugging your shoulder while you press your hand against the door frame. Keep your shoulder blade tucked down toward the middle of your back. 4. Hold for __________ seconds. 5. Slowly release the tension, and relax your muscles completely before you do the exercise again. Repeat __________ times. Complete this exercise __________ times a day. Exercise D: Scapular protraction, supine  1. Lie on your back on a firm surface. Hold a __________ weight in your left / right hand. 2. Raise your left / right arm straight into the air so your hand is directly above your shoulder joint. 3. Push the weight into the air so your shoulder lifts off of the surface that you are lying on. Do not move your head, neck, or back. 4. Hold for __________ seconds. 5. Slowly return to the starting position. Let your muscles relax completely before you repeat this exercise. Repeat __________ times. Complete this exercise __________ times a day.

## 2017-04-09 NOTE — Progress Notes (Signed)
   Subjective:    Patient ID: Becky Moore, female    DOB: [birth year], 46 y.o.   MRN: [id number]  HPI  46 year old patient who presents with a two-month history of right shoulder pain.  For the past 3 months she has been working in a [employer].  The does require considerable lifting and physical exertion.  Pain is aggravated by movement of the shoulder and occasionally head turning, especially head turning to the right.  She has been using topical Biofreeze with unclear benefit.  Patient has daily pain, but the intensity fluctuates.  She has difficulty sleeping on her right shoulder due to discomfort.  She discontinued tobacco products.  6 months ago  She is also requesting FMLA form completion due to episodic vertigo and flares of low back pain    Review of Systems  Constitutional: Negative.   HENT: Negative for congestion, dental problem, hearing loss, rhinorrhea, sinus pressure, sore throat and tinnitus.   Eyes: Negative for pain, discharge and visual disturbance.  Respiratory: Negative for cough and shortness of breath.   Cardiovascular: Negative for chest pain, palpitations and leg swelling.  Gastrointestinal: Negative for abdominal distention, abdominal pain, blood in stool, constipation, diarrhea, nausea and vomiting.  Genitourinary: Negative for difficulty urinating, dysuria, flank pain, frequency, hematuria, pelvic pain, urgency, vaginal bleeding, vaginal discharge and vaginal pain.  Musculoskeletal: Negative for arthralgias, gait problem and joint swelling.       Right shoulder pain  Skin: Negative for rash.  Neurological: Negative for dizziness, syncope, speech difficulty, weakness, numbness and headaches.  Hematological: Negative for adenopathy.  Psychiatric/Behavioral: Negative for agitation, behavioral problems and dysphoric mood. The patient is not nervous/anxious.        Objective:   Physical Exam  Constitutional: She appears well-developed and well-nourished. She  appears distressed.  Musculoskeletal:  Normal.  Drop arm test and negative painful arc test Slight discomfort with full external rotation of the shoulder.  Negative external rotation resistance test  Full range of motion of the head, neck without discomfort          Assessment & Plan:    probable mild rotator cuff tendinopathy.   FLMA form completion  Will treat with Depo-Medrol 80.  Right deltoid Rehabilitation exercises discussed and patient information dispensed     Nyoka Cowden

## 2017-11-14 ENCOUNTER — Ambulatory Visit (INDEPENDENT_AMBULATORY_CARE_PROVIDER_SITE_OTHER): Payer: BLUE CROSS/BLUE SHIELD | Admitting: Internal Medicine

## 2017-11-14 ENCOUNTER — Encounter: Payer: Self-pay | Admitting: Internal Medicine

## 2017-11-14 VITALS — BP 132/70 | HR 84 | Temp 97.9°F | Wt 197.0 lb

## 2017-11-14 DIAGNOSIS — Z86010 Personal history of colonic polyps: Secondary | ICD-10-CM

## 2017-11-14 DIAGNOSIS — Z0000 Encounter for general adult medical examination without abnormal findings: Secondary | ICD-10-CM | POA: Diagnosis not present

## 2017-11-14 DIAGNOSIS — E785 Hyperlipidemia, unspecified: Secondary | ICD-10-CM

## 2017-11-14 MED ORDER — MECLIZINE HCL 25 MG PO CHEW: 1.0000 | CHEWABLE_TABLET | Freq: Four times a day (QID) | ORAL | 0 refills | Status: DC | PRN

## 2017-11-14 NOTE — Patient Instructions (Signed)
It is important that you exercise regularly, at least 20 minutes 3 to 4 times per week.  If you develop chest pain or shortness of breath seek  medical attention.  Gynecology follow-up annually  Continue colonoscopies at 5-year intervals  Return in 1 year or as needed

## 2017-11-14 NOTE — Progress Notes (Signed)
Subjective:    Patient ID: Becky Moore, female    DOB: October 29, 1970, 47 y.o.   MRN: [id number]  HPI  47 year old patient who is seen today for a annual preventive health examination She is followed annually by gynecology which includes a annual mammogram. She has a history of colonic polyps and her colonoscopies are up-to-date. She is a remote smoker and discontinued tobacco use approximately 1 year ago.  Doing quite well.  Has recently completed a 5K race.  Evaluated by neurology in 2016 for right-sided weakness.  And possible TIA  Family history Father died recently 69 complications of heart failure history of diabetes.  Other died young at age 80 of complications of diabetes 3 brothers and 5 sisters.  3 sisters with diabetes  Past Medical History:  Diagnosis Date  . GERD (gastroesophageal reflux disease)   . Hyperlipidemia   . Vertigo      Social History   Socioeconomic History  . Marital status: Single    Spouse name: Not on file  . Number of children: Not on file  . Years of education: Not on file  . Highest education level: Not on file  Occupational History  . Not on file  Social Needs  . Financial resource strain: Not on file  . Food insecurity:    Worry: Not on file    Inability: Not on file  . Transportation needs:    Medical: Not on file    Non-medical: Not on file  Tobacco Use  . Smoking status: Former Smoker    Packs/day: 0.25    Types: Cigarettes    Last attempt to quit: 03/22/2015    Years since quitting: 2.6  . Smokeless tobacco: Never Used  . Tobacco comment: on Chantix x 1 month  Substance and Sexual Activity  . Alcohol use: Yes    Alcohol/week: 0.0 oz    Comment: once a month  . Drug use: No  . Sexual activity: Yes    Birth control/protection: None  Lifestyle  . Physical activity:    Days per week: Not on file    Minutes per session: Not on file  . Stress: Not on file  Relationships  . Social connections:    Talks on phone: Not on  file    Gets together: Not on file    Attends religious service: Not on file    Active member of club or organization: Not on file    Attends meetings of clubs or organizations: Not on file    Relationship status: Not on file  . Intimate partner violence:    Fear of current or ex partner: Not on file    Emotionally abused: Not on file    Physically abused: Not on file    Forced sexual activity: Not on file  Other Topics Concern  . Not on file  Social History Narrative   Some college     Past Surgical History:  Procedure Laterality Date  . CHOLECYSTECTOMY    . HERNIA REPAIR      Family History  Problem Relation Age of Onset  . Colon cancer Neg Hx     No Known Allergies  Current Outpatient Medications on File Prior to Visit  Medication Sig Dispense Refill  . fluticasone (FLONASE) 50 MCG/ACT nasal spray Place 2 sprays into both nostrils daily. 16 g 6  . Meclizine HCl 25 MG CHEW Chew 1 tablet (25 mg total) by mouth 4 (four) times daily as needed. 90 each 0  .  promethazine (PHENERGAN) 25 MG tablet Take 1 tablet (25 mg total) by mouth every 6 (six) hours as needed for nausea or vomiting. 40 tablet 0   No current facility-administered medications on file prior to visit.     BP 132/70 (BP Location: Right Arm, Patient Position: Sitting, Cuff Size: Large)   Pulse 84   Temp 97.9 F (36.6 C) (Oral)   Wt 197 lb (89.4 kg)   LMP 11/14/2017   SpO2 100%   BMI 32.78 kg/m     Review of Systems  Constitutional: Negative.   HENT: Negative for congestion, dental problem, hearing loss, rhinorrhea, sinus pressure, sore throat and tinnitus.   Eyes: Negative for pain, discharge and visual disturbance.  Respiratory: Negative for cough and shortness of breath.   Cardiovascular: Negative for chest pain, palpitations and leg swelling.  Gastrointestinal: Negative for abdominal distention, abdominal pain, blood in stool, constipation, diarrhea, nausea and vomiting.  Genitourinary: Negative  for difficulty urinating, dysuria, flank pain, frequency, hematuria, pelvic pain, urgency, vaginal bleeding, vaginal discharge and vaginal pain.  Musculoskeletal: Positive for back pain. Negative for arthralgias, gait problem and joint swelling.  Skin: Negative for rash.  Neurological: Positive for dizziness and light-headedness. Negative for syncope, speech difficulty, weakness, numbness and headaches.  Hematological: Negative for adenopathy.  Psychiatric/Behavioral: Negative for agitation, behavioral problems and dysphoric mood. The patient is not nervous/anxious.        Objective:   Physical Exam  Constitutional: She is oriented to person, place, and time. She appears well-developed and well-nourished.  HENT:  Head: Normocephalic and atraumatic.  Right Ear: External ear normal.  Left Ear: External ear normal.  Mouth/Throat: Oropharynx is clear and moist.  Eyes: Conjunctivae and EOM are normal.  Neck: Normal range of motion. Neck supple. No JVD present. No thyromegaly present.  Cardiovascular: Normal rate, regular rhythm, normal heart sounds and intact distal pulses.  No murmur heard. Pulmonary/Chest: Effort normal and breath sounds normal. She has no wheezes. She has no rales.  Abdominal: Soft. Bowel sounds are normal. She exhibits no distension and no mass. There is no tenderness. There is no rebound and no guarding.  Musculoskeletal: Normal range of motion. She exhibits no edema or tenderness.  Neurological: She is alert and oriented to person, place, and time. She has normal reflexes. She displays normal reflexes. No cranial nerve deficit. She exhibits normal muscle tone. Coordination normal.  Skin: Skin is warm and dry. No rash noted.  Psychiatric: She has a normal mood and affect. Her behavior is normal.          Assessment & Plan:   Preventive health examination.  Will review updated lab.  Continue efforts at aggressive risk factor modification History of colonic polyps.   We will continue 5-year interval colonoscopies Intermittent low back pain Episodic vertigo History of mild dyslipidemia.  Will review a lipid profile  Gynecology follow-up as scheduled Return in 1 year for annual exam  Nyoka Cowden

## 2017-11-15 LAB — CBC WITH DIFFERENTIAL/PLATELET
BASOS PCT: 0.2 %
Basophils Absolute: 16 cells/uL (ref 0–200)
EOS ABS: 107 {cells}/uL (ref 15–500)
Eosinophils Relative: 1.3 %
HCT: 42.6 % (ref 35.0–45.0)
Hemoglobin: 14.6 g/dL (ref 11.7–15.5)
LYMPHS ABS: 2526 {cells}/uL (ref 850–3900)
MCH: 30 pg (ref 27.0–33.0)
MCHC: 34.3 g/dL (ref 32.0–36.0)
MCV: 87.7 fL (ref 80.0–100.0)
MPV: 10.4 fL (ref 7.5–12.5)
Monocytes Relative: 8.5 %
NEUTROS PCT: 59.2 %
Neutro Abs: 4854 cells/uL (ref 1500–7800)
Platelets: 295 10*3/uL (ref 140–400)
RBC: 4.86 10*6/uL (ref 3.80–5.10)
RDW: 12.7 % (ref 11.0–15.0)
Total Lymphocyte: 30.8 %
WBC: 8.2 10*3/uL (ref 3.8–10.8)
WBCMIX: 697 {cells}/uL (ref 200–950)

## 2017-11-15 LAB — COMPREHENSIVE METABOLIC PANEL
AG Ratio: 1.5 (calc) (ref 1.0–2.5)
ALBUMIN MSPROF: 4.6 g/dL (ref 3.6–5.1)
ALKALINE PHOSPHATASE (APISO): 81 U/L (ref 33–115)
ALT: 11 U/L (ref 6–29)
AST: 20 U/L (ref 10–35)
BUN: 8 mg/dL (ref 7–25)
CO2: 25 mmol/L (ref 20–32)
CREATININE: 0.86 mg/dL (ref 0.50–1.10)
Calcium: 9.8 mg/dL (ref 8.6–10.2)
Chloride: 104 mmol/L (ref 98–110)
GLOBULIN: 3.1 g/dL (ref 1.9–3.7)
Glucose, Bld: 101 mg/dL — ABNORMAL HIGH (ref 65–99)
POTASSIUM: 4.3 mmol/L (ref 3.5–5.3)
Sodium: 140 mmol/L (ref 135–146)
Total Bilirubin: 0.7 mg/dL (ref 0.2–1.2)
Total Protein: 7.7 g/dL (ref 6.1–8.1)

## 2017-11-15 LAB — LIPID PANEL
Cholesterol: 226 mg/dL — ABNORMAL HIGH (ref <200)
HDL: 54 mg/dL (ref >50)
LDL CHOLESTEROL (CALC): 151 mg/dL — AB
NON-HDL CHOLESTEROL (CALC): 172 mg/dL — AB (ref <130)
TRIGLYCERIDES: 98 mg/dL (ref <150)
Total CHOL/HDL Ratio: 4.2 (calc) (ref <5.0)

## 2017-11-15 LAB — TSH: TSH: 2.3 m[IU]/L

## 2018-01-10 ENCOUNTER — Encounter: Payer: Self-pay | Admitting: Family Medicine

## 2018-01-10 ENCOUNTER — Ambulatory Visit (INDEPENDENT_AMBULATORY_CARE_PROVIDER_SITE_OTHER): Payer: BLUE CROSS/BLUE SHIELD | Admitting: Family Medicine

## 2018-01-10 VITALS — BP 110/78 | HR 74 | Temp 97.8°F | Ht 65.0 in | Wt 201.0 lb

## 2018-01-10 DIAGNOSIS — Z131 Encounter for screening for diabetes mellitus: Secondary | ICD-10-CM | POA: Diagnosis not present

## 2018-01-10 DIAGNOSIS — E782 Mixed hyperlipidemia: Secondary | ICD-10-CM | POA: Diagnosis not present

## 2018-01-10 DIAGNOSIS — R42 Dizziness and giddiness: Secondary | ICD-10-CM

## 2018-01-10 LAB — POCT GLYCOSYLATED HEMOGLOBIN (HGB A1C): Hemoglobin A1C: 5.6 % (ref 4.0–5.6)

## 2018-01-10 MED ORDER — MECLIZINE HCL 25 MG PO CHEW: 1.0000 | CHEWABLE_TABLET | Freq: Four times a day (QID) | ORAL | 0 refills | Status: DC | PRN

## 2018-01-10 NOTE — Progress Notes (Signed)
Subjective:    Patient ID: Becky Moore, female    DOB: 1971/02/03, 47 y.o.   MRN: [id number]  No chief complaint on file.   HPI Patient was seen today for f/u on chronic issues and to transfer care from Dr. Burnice Logan.   Vertigo: -uses meclazine 25 mg chew tab prn  -no recent episode, but likes to have med available in case -drinking 4 bottles of water at work, does not drink tea and sodas. -did not pick up last refill from pharmacy as the packaging was different than what she is use to.  Pharmacy tried to charge her per package.  Typically gets tabs in a bottle for one price.  HLD: -pt wanted to review labs from May 2019, states was not notified of results, but saw them on mychart. -states is eating more fish (sardines, tuna).  -Wants to do better as almost 48 yo  Pt requesting hgb A1C. Past Medical History:  Diagnosis Date  . GERD (gastroesophageal reflux disease)   . Hyperlipidemia   . Vertigo     No Known Allergies  ROS General: Denies fever, chills, night sweats, changes in weight, changes in appetite HEENT: Denies headaches, ear pain, changes in vision, rhinorrhea, sore throat CV: Denies CP, palpitations, SOB, orthopnea Pulm: Denies SOB, cough, wheezing GI: Denies abdominal pain, nausea, vomiting, diarrhea, constipation GU: Denies dysuria, hematuria, frequency, vaginal discharge Msk: Denies muscle cramps, joint pains Neuro: Denies weakness, numbness, tingling Skin: Denies rashes, bruising Psych: Denies depression, anxiety, hallucinations     Objective:    Blood pressure 110/78, pulse 74, temperature 97.8 F (36.6 C), temperature source Oral, height 5\' 5"  (1.651 m), weight 201 lb (91.2 kg), last menstrual period 12/11/2017, SpO2 99 %.   Gen. Pleasant, well-nourished, in no distress, normal affect  HEENT: [place]/AT, face symmetric, no scleral icterus, PERRLA, nares patent without drainage Lungs: no accessory muscle use Cardiovascular: RRR, no peripheral  edema Neuro:  A&Ox3, CN II-XII intact, normal gait   Wt Readings from Last 3 Encounters:  01/10/18 201 lb (91.2 kg)  11/14/17 197 lb (89.4 kg)  04/09/17 198 lb 12.8 oz (90.2 kg)    Lab Results  Component Value Date   WBC 8.2 11/14/2017   HGB 14.6 11/14/2017   HCT 42.6 11/14/2017   PLT 295 11/14/2017   GLUCOSE 101 (H) 11/14/2017   CHOL 226 (H) 11/14/2017   TRIG 98 11/14/2017   HDL 54 11/14/2017   LDLCALC 151 (H) 11/14/2017   ALT 11 11/14/2017   AST 20 11/14/2017   NA 140 11/14/2017   K 4.3 11/14/2017   CL 104 11/14/2017   CREATININE 0.86 11/14/2017   BUN 8 11/14/2017   CO2 25 11/14/2017   TSH 2.30 11/14/2017   HGBA1C 5.7 07/25/2010    Assessment/Plan:  Mixed hyperlipidemia -discussed lifestyle modifications x 6 months -will recheck lipid panel in 6 months.  Order placed. -given handouts  Vertigo -h/o.  Currently stable -rx for Meclizine resent to pharmacy.  Pt advised to discuss packaging with pharmacy.  Screening for diabetes mellitus  - Plan: POC HgB A1c 5.6% -discussed increasing physical activity, decreasing intake of sweets and carbohydrates.  F/u prn  Grier Mitts, MD

## 2018-01-10 NOTE — Patient Instructions (Addendum)
Total cholesterol was high at 226 (nml is <200) and LDL (bad) cholesterol was 151 which is also elevated (nml is <99).  You can decrease these numbers by: -limiting your intake of processed foods and foods high in saturated fats.  -increasing your physical activity -increasing your intake of vegetables, healthy fish (bluefin tuna, wild salmon, and sardines), whole grains and fiber rich foods .   -You can also use extra virgin olive oil instead of butter. -If you smoke, quitting can decrease LDL cholesterol and raise HDL cholesterol.  We can recheck your cholesterol in 6 months, if you continue to have elevated numbers we will discuss medication options at that time.  High Cholesterol High cholesterol is a condition in which the blood has high levels of a white, waxy, fat-like substance (cholesterol). The human body needs small amounts of cholesterol. The liver makes all the cholesterol that the body needs. Extra (excess) cholesterol comes from the food that we eat. Cholesterol is carried from the liver by the blood through the blood vessels. If you have high cholesterol, deposits (plaques) may build up on the walls of your blood vessels (arteries). Plaques make the arteries narrower and stiffer. Cholesterol plaques increase your risk for heart attack and stroke. Work with your health care provider to keep your cholesterol levels in a healthy range. What increases the risk? This condition is more likely to develop in people who:  Eat foods that are high in animal fat (saturated fat) or cholesterol.  Are overweight.  Are not getting enough exercise.  Have a family history of high cholesterol.  What are the signs or symptoms? There are no symptoms of this condition. How is this diagnosed? This condition may be diagnosed from the results of a blood test.  If you are older than age 48, your health care provider may check your cholesterol every 4-6 years.  You may be checked more often if you  already have high cholesterol or other risk factors for heart disease.  The blood test for cholesterol measures:  "Bad" cholesterol (LDL cholesterol). This is the main type of cholesterol that causes heart disease. The desired level for LDL is less than 100.  "Good" cholesterol (HDL cholesterol). This type helps to protect against heart disease by cleaning the arteries and carrying the LDL away. The desired level for HDL is 60 or higher.  Triglycerides. These are fats that the body can store or burn for energy. The desired number for triglycerides is lower than 150.  Total cholesterol. This is a measure of the total amount of cholesterol in your blood, including LDL cholesterol, HDL cholesterol, and triglycerides. A healthy number is less than 200.  How is this treated? This condition is treated with diet changes, lifestyle changes, and medicines. Diet changes  This may include eating more whole grains, fruits, vegetables, nuts, and fish.  This may also include cutting back on red meat and foods that have a lot of added sugar. Lifestyle changes  Changes may include getting at least 40 minutes of aerobic exercise 3 times a week. Aerobic exercises include walking, biking, and swimming. Aerobic exercise along with a healthy diet can help you maintain a healthy weight.  Changes may also include quitting smoking. Medicines  Medicines are usually given if diet and lifestyle changes have failed to reduce your cholesterol to healthy levels.  Your health care provider may prescribe a statin medicine. Statin medicines have been shown to reduce cholesterol, which can reduce the risk of heart disease.  Follow these instructions at home: Eating and drinking  If told by your health care provider:  Eat chicken (without skin), fish, veal, shellfish, ground [place] breast, and round or loin cuts of red meat.  Do not eat fried foods or fatty meats, such as hot dogs and salami.  Eat plenty of  fruits, such as apples.  Eat plenty of vegetables, such as broccoli, potatoes, and carrots.  Eat beans, peas, and lentils.  Eat grains such as barley, rice, couscous, and bulgur wheat.  Eat pasta without cream sauces.  Use skim or nonfat milk, and eat low-fat or nonfat yogurt and cheeses.  Do not eat or drink whole milk, cream, ice cream, egg yolks, or hard cheeses.  Do not eat stick margarine or tub margarines that contain trans fats (also called partially hydrogenated oils).  Do not eat saturated tropical oils, such as coconut oil and palm oil.  Do not eat cakes, cookies, crackers, or other baked goods that contain trans fats.  General instructions  Exercise as directed by your health care provider. Increase your activity level with activities such as gardening, walking, and taking the stairs.  Take over-the-counter and prescription medicines only as told by your health care provider.  Do not use any products that contain nicotine or tobacco, such as cigarettes and e-cigarettes. If you need help quitting, ask your health care provider.  Keep all follow-up visits as told by your health care provider. This is important. Contact a health care provider if:  You are struggling to maintain a healthy diet or weight.  You need help to start on an exercise program.  You need help to stop smoking. Get help right away if:  You have chest pain.  You have trouble breathing. This information is not intended to replace advice given to you by your health care provider. Make sure you discuss any questions you have with your health care provider. Document Released: 06/18/2005 Document Revised: 01/14/2016 Document Reviewed: 12/17/2015 Elsevier Interactive Patient Education  2018 [employer].  Vertigo Vertigo means that you feel like you are moving when you are not. Vertigo can also make you feel like things around you are moving when they are not. This feeling can come and go at any  time. Vertigo often goes away on its own. Follow these instructions at home:  Avoid making fast movements.  Avoid driving.  Avoid using heavy machinery.  Avoid doing any task or activity that might cause danger to you or other people if you would have a vertigo attack while you are doing it.  Sit down right away if you feel dizzy or have trouble with your balance.  Take over-the-counter and prescription medicines only as told by your doctor.  Follow instructions from your doctor about which positions or movements you should avoid.  Drink enough fluid to keep your pee (urine) clear or pale yellow.  Keep all follow-up visits as told by your doctor. This is important. Contact a doctor if:  Medicine does not help your vertigo.  You have a fever.  Your problems get worse or you have new symptoms.  Your family or friends see changes in your behavior.  You feel sick to your stomach (nauseous) or you throw up (vomit).  You have a "pins and needles" feeling or you are numb in part of your body. Get help right away if:  You have trouble moving or talking.  You are always dizzy.  You pass out (faint).  You get very bad  headaches.  You feel weak or have trouble using your hands, arms, or legs.  You have changes in your hearing.  You have changes in your seeing (vision).  You get a stiff neck.  Bright light starts to bother you. This information is not intended to replace advice given to you by your health care provider. Make sure you discuss any questions you have with your health care provider. Document Released: 03/27/2008 Document Revised: 11/24/2015 Document Reviewed: 10/11/2014 Elsevier Interactive Patient Education  [employer].

## 2018-01-15 ENCOUNTER — Telehealth: Payer: Self-pay | Admitting: Family Medicine

## 2018-01-15 NOTE — Telephone Encounter (Signed)
Copied from [place] [id number]. Topic: Quick Communication - See Telephone Encounter >> Jan 15, 2018  8:17 AM Ahmed Prima L wrote: CRM for notification. See Telephone encounter for: 01/15/18.  Patient wants to know if Dr Volanda Napoleon would give her an actual prescription for promethazine (PHENERGAN) 25 MG tablet. She said all the pharmacy is doing now is getting it off the shelf for her. She said it is around $10 a box to do it that way and rather have a prescription because it would be cheaper on her. She does not want the pills to be dissolvable pills.   [id number] - , [place] [address]

## 2018-01-20 NOTE — Telephone Encounter (Signed)
I think pt may be confused about her medications.  1. Phenergan is not OTC.  2. If pt is talking about the meclizine, the rx was re-written for her/refilled from the last time she was given this med.  I cannot continue to go back and forth with pt about what the pharmacy is doing despite being given a prescription.

## 2018-01-20 NOTE — Telephone Encounter (Signed)
Last OV 01/10/2018 for vertigo   Last refilled 10/12/2016 disp 40 with no refills   Sent to PCP to advise

## 2018-01-24 NOTE — Telephone Encounter (Signed)
Rx was refilled a her last visit, spoke with pt aware that the Rx is no OTC

## 2018-02-24 ENCOUNTER — Telehealth: Payer: Self-pay | Admitting: Internal Medicine

## 2018-02-24 ENCOUNTER — Encounter: Payer: Self-pay | Admitting: Internal Medicine

## 2018-02-24 NOTE — Telephone Encounter (Signed)
Letter dictated

## 2018-02-24 NOTE — Telephone Encounter (Signed)
Pt FMLA paperwork was filled out by you 10/2017. Pt is now a Dr.Banks pt but wants you to write a letter for her sciatca flare ups since this was

## 2018-02-24 NOTE — Telephone Encounter (Signed)
Patient formerly got FMLA paperwork filled out by Dr Burnice Logan. Patient needs a note stating she can't push, pull or lift more than 5 lbs in relation to her sciatica pain.  Please call patient as soon as possible as she states she needs this today so she can work.

## 2018-02-24 NOTE — Telephone Encounter (Signed)
Pt FMLA paperwork was filled out by you 10/2017. Her job is requesting more information about her sciatica so she needs a letter that excuses her from the issues she listed below.Pt is now a Dr.Banks pt but wants you to write the letter for her sciatca flare ups since the FMLA was filled out by you. Please advise.

## 2018-02-25 NOTE — Telephone Encounter (Signed)
I am not sure I can offer any stronger statements than the general comments in the initial letter. This individual does run in 4K races!

## 2018-02-25 NOTE — Telephone Encounter (Signed)
Noted. Pt will be notified. 

## 2018-02-25 NOTE — Telephone Encounter (Signed)
Spoke to pt and informed her that her letter was ready for pick-up. Pt stated she received the letter however, HR did not accept it. She claimed that it need to be in more details. Pt wanted to know if this issue needs to be with Dr.Banks or should Dr.Kwiatkowski continue to handle the issue with FMLA. Also pt was very concerned about her July labs as well as her labs from May this year. She stated some of her numbers were out of range and she did not get a call back neither time.   Please advise!

## 2018-02-25 NOTE — Telephone Encounter (Signed)
Spoke to pt and informed her that Dr.Banks will get her message when she gets in next week. FYI: Pt is also want to transfer care due to her recent move to [facility].

## 2018-02-26 NOTE — Telephone Encounter (Signed)
Please let pt know ok to transfer.  Labs from May (not ordered by this provider) were reviewed by this provider on 01/10/18 at [facility] per my note.    This provider unable to provide further comment on note requested.  Note per Dr. Raliegh Ip seems sufficient.

## 2018-02-27 NOTE — Telephone Encounter (Signed)
Patient notified of results and verbalized understanding.  

## 2018-02-27 NOTE — Telephone Encounter (Signed)
Okay for patient to transfer to you Dr. Deborra Medina?

## 2018-02-27 NOTE — Telephone Encounter (Signed)
Patient calling to get [facility] appointment with Dr Deborra Medina. TOC ok'd by Dr Volanda Napoleon. Transferring office due to moving. Patient requesting Dr Deborra Medina.

## 2019-07-09 ENCOUNTER — Telehealth: Payer: Self-pay

## 2019-07-09 NOTE — Telephone Encounter (Signed)
NOTES ON FILE FROM DR Ival Bible [phone] SENT REFERRAL TO SCHEDULING

## 2019-07-15 ENCOUNTER — Encounter: Payer: Self-pay | Admitting: Cardiology

## 2019-07-15 ENCOUNTER — Other Ambulatory Visit: Payer: Self-pay

## 2019-07-15 ENCOUNTER — Ambulatory Visit (INDEPENDENT_AMBULATORY_CARE_PROVIDER_SITE_OTHER): Payer: BC Managed Care – PPO | Admitting: Cardiology

## 2019-07-15 VITALS — BP 118/78 | HR 69 | Temp 97.7°F | Ht 67.0 in | Wt 195.0 lb

## 2019-07-15 DIAGNOSIS — R0602 Shortness of breath: Secondary | ICD-10-CM

## 2019-07-15 DIAGNOSIS — E785 Hyperlipidemia, unspecified: Secondary | ICD-10-CM

## 2019-07-15 DIAGNOSIS — R7303 Prediabetes: Secondary | ICD-10-CM | POA: Diagnosis not present

## 2019-07-15 NOTE — Progress Notes (Signed)
Cardiology Office Note:    Date:  07/16/2019   ID:  Becky Moore, DOB [birth year], MRN [id number]  PCP:  Hayden Rasmussen, MD  Cardiologist:  No primary care provider on file.  Electrophysiologist:  None   Referring MD: Billie Ruddy, MD   Chief Complaint  Patient presents with  . New Patient (Initial Visit)  . Hyperlipidemia    History of Present Illness:    Becky Moore is a 49 y.o. female with a hx of hyperlipdemia, prediabetes who is referred by Dr. Volanda Napoleon for an evaluation of cardiovascular risk assessment.  Patient reports that she has been doing well.  She was doing yoga for exercise but had to stop.  Not really doing any exercise currently, works at [employer] and walks a lot each day.  She denies any exertional chest pain or dyspnea.  She quit smoking 2 months ago.  She is on Chantix.  She was started on lovastatin, was planning to take first dose today.  However she states she does not want to start a statin, as she does not like taking medications.  TTE in 06/16/15 shows Normal LV systolic function; grade 1 diastolic dysfunction; trace   MR and TR; atrial septal aneurysm.    Past Medical History:  Diagnosis Date  . GERD (gastroesophageal reflux disease)   . Hyperlipidemia   . Vertigo     Past Surgical History:  Procedure Laterality Date  . CHOLECYSTECTOMY    . HERNIA REPAIR      Current Medications: Current Meds  Medication Sig  . finasteride (PROSCAR) 5 MG tablet Take 5 mg by mouth daily.  . fluticasone (FLONASE) 50 MCG/ACT nasal spray Place 2 sprays into both nostrils daily.  Marland Kitchen LOVASTATIN PO Take 1 tablet by mouth daily.  . Meclizine HCl 25 MG CHEW Chew 1 tablet (25 mg total) by mouth 4 (four) times daily as needed.  . promethazine (PHENERGAN) 25 MG tablet Take 1 tablet (25 mg total) by mouth every 6 (six) hours as needed for nausea or vomiting.  . Varenicline Tartrate (CHANTIX PO) Take 1 tablet by mouth daily.  Marland Kitchen VITAMIN D,  CHOLECALCIFEROL, PO Take 1 tablet by mouth daily.     Allergies:   Patient has no known allergies.   Social History   Socioeconomic History  . Marital status: Single    Spouse name: Not on file  . Number of children: Not on file  . Years of education: Not on file  . Highest education level: Not on file  Occupational History  . Not on file  Tobacco Use  . Smoking status: Former Smoker    Packs/day: 0.25    Types: Cigarettes    Quit date: 03/22/2015    Years since quitting: 4.3  . Smokeless tobacco: Never Used  . Tobacco comment: on Chantix x 1 month  Substance and Sexual Activity  . Alcohol use: Yes    Alcohol/week: 0.0 standard drinks    Comment: once a month  . Drug use: No  . Sexual activity: Yes    Birth control/protection: None  Other Topics Concern  . Not on file  Social History Narrative   Some college    Social Determinants of Health   Financial Resource Strain:   . Difficulty of Paying Living Expenses: Not on file  Food Insecurity:   . Worried About Charity fundraiser in the Last Year: Not on file  . Ran Out of Food in the Last Year: Not  on file  Transportation Needs:   . Lack of Transportation (Medical): Not on file  . Lack of Transportation (Non-Medical): Not on file  Physical Activity:   . Days of Exercise per Week: Not on file  . Minutes of Exercise per Session: Not on file  Stress:   . Feeling of Stress : Not on file  Social Connections:   . Frequency of Communication with Friends and Family: Not on file  . Frequency of Social Gatherings with Friends and Family: Not on file  . Attends Religious Services: Not on file  . Active Member of Clubs or Organizations: Not on file  . Attends Archivist Meetings: Not on file  . Marital Status: Not on file     Family History: The patient's *family history is negative for Colon cancer.  ROS:   Please see the history of present illness.     All other systems reviewed and are  negative.  EKGs/Labs/Other Studies Reviewed:    The following studies were reviewed today:   EKG:  EKG is  ordered today.  The ekg ordered today demonstrates normal sinus rhythm, rate 69, no ST/T abnormalities  Recent Labs: No results found for requested labs within last 8760 hours.  Recent Lipid Panel    Component Value Date/Time   CHOL 226 (H) 11/14/2017 0921   TRIG 98 11/14/2017 0921   HDL 54 11/14/2017 0921   CHOLHDL 4.2 11/14/2017 0921   VLDL 22.8 10/01/2016 1528   LDLCALC 151 (H) 11/14/2017 0921    Physical Exam:    VS:  BP 118/78 (BP Location: Left Arm, Patient Position: Sitting, Cuff Size: Normal)   Pulse 69   Temp 97.7 F (36.5 C)   Ht 5\' 7"  (1.702 m)   Wt 195 lb (88.5 kg)   BMI 30.54 kg/m     Wt Readings from Last 3 Encounters:  07/15/19 195 lb (88.5 kg)  01/10/18 201 lb (91.2 kg)  11/14/17 197 lb (89.4 kg)     GEN:  Well nourished, well developed in no acute distress HEENT: Normal NECK: No JVD; No carotid bruits LYMPHATICS: No lymphadenopathy CARDIAC: RRR, no murmurs, rubs, gallops RESPIRATORY:  Clear to auscultation without rales, wheezing or rhonchi  ABDOMEN: Soft, non-tender, non-distended MUSCULOSKELETAL:  No edema; No deformity  SKIN: Warm and dry NEUROLOGIC:  Alert and oriented x 3 PSYCHIATRIC:  Normal affect   ASSESSMENT:    1. Dyslipidemia   2. DYSPNEA   3. Prediabetes    PLAN:    In order of problems listed above:   Hyperlipidemia: LDL 146 on 07/07/2019.  She was prescribed lovastatin, was going to start taking today but she does not wish to start any medication.  Her 10-year ASCVD risk score is 1.4%, so she would not meet an indication for statin at this time.  Discussed that we could check a calcium score, and if no coronary calcifications, can hold off on statin at this time.  Patient would like to proceed with calcium score.  Prediabetes: A1c 5.7.    RTC in 3 months   Medication Adjustments/Labs and Tests Ordered: Current  medicines are reviewed at length with the patient today.  Concerns regarding medicines are outlined above.  Orders Placed This Encounter  Procedures  . CT CARDIAC SCORING  . EKG 12-Lead   No orders of the defined types were placed in this encounter.   Patient Instructions  Medication Instructions:  Continue same medications *If you need a refill on your cardiac medications  before your next appointment, please call your pharmacy*  Lab Work: None ordered   Testing/Procedures: Schedule Calcium Score  Follow-Up: At [facility], you and your health needs are our priority.  As part of our continuing mission to provide you with exceptional heart care, we have created designated Provider Care Teams.  These Care Teams include your primary Cardiologist (physician) and Advanced Practice Providers (APPs -  Physician Assistants and Nurse Practitioners) who all work together to provide you with the care you need, when you need it.  Your next appointment:  3 months  The format for your next appointment:  Office   Provider: Dr.Tonae Livolsi       Signed, Donato Heinz, MD  07/16/2019 11:39 AM    [place]

## 2019-07-15 NOTE — Patient Instructions (Signed)
Medication Instructions:  Continue same medications *If you need a refill on your cardiac medications before your next appointment, please call your pharmacy*  Lab Work: None ordered   Testing/Procedures: Schedule Calcium Score  Follow-Up: At [facility], you and your health needs are our priority.  As part of our continuing mission to provide you with exceptional heart care, we have created designated Provider Care Teams.  These Care Teams include your primary Cardiologist (physician) and Advanced Practice Providers (APPs -  Physician Assistants and Nurse Practitioners) who all work together to provide you with the care you need, when you need it.  Your next appointment:  3 months  The format for your next appointment:  Office   Provider: Dr.Schumann

## 2019-07-20 ENCOUNTER — Ambulatory Visit (INDEPENDENT_AMBULATORY_CARE_PROVIDER_SITE_OTHER)
Admission: RE | Admit: 2019-07-20 | Discharge: 2019-07-20 | Disposition: A | Discharge status: Home / Self Care | Payer: Self-pay | Source: Ambulatory Visit | Attending: Cardiology | Admitting: Cardiology

## 2019-07-20 ENCOUNTER — Other Ambulatory Visit: Payer: Self-pay

## 2019-07-20 DIAGNOSIS — R0602 Shortness of breath: Secondary | ICD-10-CM

## 2019-07-20 DIAGNOSIS — E785 Hyperlipidemia, unspecified: Secondary | ICD-10-CM

## 2019-07-23 ENCOUNTER — Other Ambulatory Visit: Payer: Self-pay

## 2019-07-23 MED ORDER — ATORVASTATIN CALCIUM 40 MG PO TABS: 40.0000 mg | ORAL_TABLET | Freq: Every day | ORAL | 3 refills | Status: DC

## 2019-09-10 ENCOUNTER — Encounter: Payer: Self-pay | Admitting: Gastroenterology

## 2019-09-30 ENCOUNTER — Encounter: Payer: Self-pay | Admitting: Cardiology

## 2019-09-30 ENCOUNTER — Other Ambulatory Visit: Payer: Self-pay

## 2019-09-30 ENCOUNTER — Ambulatory Visit (INDEPENDENT_AMBULATORY_CARE_PROVIDER_SITE_OTHER): Payer: BC Managed Care – PPO | Admitting: Cardiology

## 2019-09-30 VITALS — BP 129/80 | HR 83 | Ht 65.0 in | Wt 204.8 lb

## 2019-09-30 DIAGNOSIS — R072 Precordial pain: Secondary | ICD-10-CM | POA: Diagnosis not present

## 2019-09-30 DIAGNOSIS — E785 Hyperlipidemia, unspecified: Secondary | ICD-10-CM | POA: Diagnosis not present

## 2019-09-30 DIAGNOSIS — R7303 Prediabetes: Secondary | ICD-10-CM | POA: Diagnosis not present

## 2019-09-30 MED ORDER — ATORVASTATIN CALCIUM 40 MG PO TABS: 40.0000 mg | ORAL_TABLET | Freq: Every day | ORAL | 3 refills | Status: DC

## 2019-09-30 MED ORDER — METOPROLOL TARTRATE 100 MG PO TABS: 100.0000 mg | ORAL_TABLET | Freq: Once | ORAL | 0 refills | Status: DC

## 2019-09-30 NOTE — Progress Notes (Signed)
Cardiology Office Note:    Date:  10/01/2019   ID:  Becky Moore, DOB Jul 01, 1971, MRN [id number]  PCP:  Hayden Rasmussen, MD  Cardiologist:  No primary care provider on file.  Electrophysiologist:  None   Referring MD: Hayden Rasmussen, MD   No chief complaint on file.   History of Present Illness:    Becky Moore is a 49 y.o. female with a hx of hyperlipdemia, prediabetes who presents for follow-up.  She was referred by Dr. Volanda Napoleon for an evaluation of cardiovascular risk assessment, initially seen on 07/15/2019.  She had recently been started on lovastatin but did not want to start taking medication.  Her ASCVD risk score was low, so calcium score was recommended for further risk stratification to see if statin was indicated.  Calcium score on 07/22/2019 with 88 (97th percentile).  Atorvastatin 40 mg daily was ordered, but patient did not start taking.  She reports that she has not been exercising, but is getting a dog at the end of this week and will start walking.  She does report that she has been having intermittent chest pain, occurs every few months.  Describes as pain across the upper part of her chest, can last about 15 seconds and resolves.  Has not noted a relationship with exertion.  Denies any dyspnea.  TTE in 06/16/15 shows Normal LV systolic function; grade 1 diastolic dysfunction; trace MR and TR; atrial septal aneurysm.Calcium score on 07/22/2019 with 88 (97th percentile).     Past Medical History:  Diagnosis Date  . GERD (gastroesophageal reflux disease)   . Hyperlipidemia   . Vertigo     Past Surgical History:  Procedure Laterality Date  . CHOLECYSTECTOMY    . HERNIA REPAIR      Current Medications: Current Meds  Medication Sig  . finasteride (PROSCAR) 5 MG tablet Take 5 mg by mouth daily.  . fluticasone (FLONASE) 50 MCG/ACT nasal spray Place 2 sprays into both nostrils daily.  . Meclizine HCl 25 MG CHEW Chew 1 tablet (25 mg total) by mouth 4 (four) times  daily as needed.  . promethazine (PHENERGAN) 25 MG tablet Take 1 tablet (25 mg total) by mouth every 6 (six) hours as needed for nausea or vomiting.  . Varenicline Tartrate (CHANTIX PO) Take 1 tablet by mouth daily.  Marland Kitchen VITAMIN D, CHOLECALCIFEROL, PO Take 1 tablet by mouth daily.     Allergies:   Patient has no known allergies.   Social History   Socioeconomic History  . Marital status: Single    Spouse name: Not on file  . Number of children: Not on file  . Years of education: Not on file  . Highest education level: Not on file  Occupational History  . Not on file  Tobacco Use  . Smoking status: Former Smoker    Packs/day: 0.25    Types: Cigarettes    Quit date: 03/22/2015    Years since quitting: 4.5  . Smokeless tobacco: Never Used  . Tobacco comment: on Chantix x 1 month  Substance and Sexual Activity  . Alcohol use: Yes    Alcohol/week: 0.0 standard drinks    Comment: once a month  . Drug use: No  . Sexual activity: Yes    Birth control/protection: None  Other Topics Concern  . Not on file  Social History Narrative   Some college    Social Determinants of Health   Financial Resource Strain:   . Difficulty of Paying Living  Expenses:   Food Insecurity:   . Worried About Charity fundraiser in the Last Year:   . Arboriculturist in the Last Year:   Transportation Needs:   . Film/video editor (Medical):   Marland Kitchen Lack of Transportation (Non-Medical):   Physical Activity:   . Days of Exercise per Week:   . Minutes of Exercise per Session:   Stress:   . Feeling of Stress :   Social Connections:   . Frequency of Communication with Friends and Family:   . Frequency of Social Gatherings with Friends and Family:   . Attends Religious Services:   . Active Member of Clubs or Organizations:   . Attends Archivist Meetings:   Marland Kitchen Marital Status:      Family History: The patient's *family history is negative for Colon cancer.  ROS:   Please see the history  of present illness.     All other systems reviewed and are negative.  EKGs/Labs/Other Studies Reviewed:    The following studies were reviewed today:   EKG:  EKG is not ordered today.  The ekg ordered most recently demonstrates normal sinus rhythm, rate 69, no ST/T abnormalities  Recent Labs: No results found for requested labs within last 8760 hours.  Recent Lipid Panel    Component Value Date/Time   CHOL 226 (H) 11/14/2017 0921   TRIG 98 11/14/2017 0921   HDL 54 11/14/2017 0921   CHOLHDL 4.2 11/14/2017 0921   VLDL 22.8 10/01/2016 1528   LDLCALC 151 (H) 11/14/2017 0921    Physical Exam:    VS:  BP 129/80   Pulse 83   Ht 5\' 5"  (1.651 m)   Wt 204 lb 12.8 oz (92.9 kg)   SpO2 99%   BMI 34.08 kg/m     Wt Readings from Last 3 Encounters:  09/30/19 204 lb 12.8 oz (92.9 kg)  07/15/19 195 lb (88.5 kg)  01/10/18 201 lb (91.2 kg)     GEN:  Well nourished, well developed in no acute distress HEENT: Normal NECK: No JVD; No carotid bruits LYMPHATICS: No lymphadenopathy CARDIAC: RRR, no murmurs, rubs, gallops RESPIRATORY:  Clear to auscultation without rales, wheezing or rhonchi  ABDOMEN: Soft, non-tender, non-distended MUSCULOSKELETAL:  No edema; No deformity  SKIN: Warm and dry NEUROLOGIC:  Alert and oriented x 3 PSYCHIATRIC:  Normal affect   ASSESSMENT:    1. Precordial pain   2. Hyperlipidemia, unspecified hyperlipidemia type   3. Prediabetes    PLAN:    In order of problems listed above:  Chest pain: Atypical in description but given known CAD on calcium score, will evaluate further with coronary CTA to rule out obstructive CAD.  Hyperlipidemia: LDL 146 on 07/07/2019.  Calcium score 88 (97th percentile).  She was prescribed atorvastatin after her calcium score but did not start taking.  Will start atorvastatin 40 mg daily  Prediabetes: A1c 5.7.    RTC in 3 months   Medication Adjustments/Labs and Tests Ordered: Current medicines are reviewed at length with  the patient today.  Concerns regarding medicines are outlined above.  Orders Placed This Encounter  Procedures  . CT CORONARY MORPH W/CTA COR W/SCORE W/CA W/CM &/OR WO/CM  . CT CORONARY FRACTIONAL FLOW RESERVE DATA PREP  . CT CORONARY FRACTIONAL FLOW RESERVE FLUID ANALYSIS  . Basic metabolic panel   Meds ordered this encounter  Medications  . metoprolol tartrate (LOPRESSOR) 100 MG tablet    Sig: Take 1 tablet (100 mg total)  by mouth once for 1 dose. TAKE 2 HOURS PRIOR TO CORONARY CT    Dispense:  1 tablet    Refill:  0  . atorvastatin (LIPITOR) 40 MG tablet    Sig: Take 1 tablet (40 mg total) by mouth daily.    Dispense:  90 tablet    Refill:  3    Patient Instructions  Medication Instructions:  BEGIN TAKING ATORVASTATIN 40MG  DAILY  ONE TIME DOES OF METOPROLOL TARTRATE 100MG  TO BE TAKEN 2 HOURS PRIOR TO CORONARY CT  *If you need a refill on your cardiac medications before your next appointment, please call your pharmacy*   Lab Work: BMET, 2 [place]  If you have labs (blood work) drawn today and your tests are completely normal, you will receive your results only by: Marland Kitchen MyChart Message (if you have MyChart) OR . A paper copy in the mail If you have any lab test that is abnormal or we need to change your treatment, we will call you to review the results.   Testing/Procedures: CORONARY CT- SEE INSTRUCTIONS BELOW   Follow-Up: At [facility], you and your health needs are our priority.  As part of our continuing mission to provide you with exceptional heart care, we have created designated Provider Care Teams.  These Care Teams include your primary Cardiologist (physician) and Advanced Practice Providers (APPs -  Physician Assistants and Nurse Practitioners) who all work together to provide you with the care you need, when you need it.  We recommend signing up for the patient portal called "MyChart".  Sign up information is provided on this After Visit  Summary.  MyChart is used to connect with patients for Virtual Visits (Telemedicine).  Patients are able to view lab/test results, encounter notes, upcoming appointments, etc.  Non-urgent messages can be sent to your provider as well.   To learn more about what you can do with MyChart, go to [email or web address].    Your next appointment:   3 month(s)  The format for your next appointment:   In Person  Provider:   Oswaldo Milian, MD   Other Instructions  Your cardiac CT will be scheduled at one of the below locations:   [facility] [address] [place], [place] [address] [phone]  If scheduled at [facility], please arrive at the [facility] main entrance of [facility] 30 minutes prior to test start time. Proceed to the [facility] Radiology Department (first floor) to check-in and test prep.   Please follow these instructions carefully (unless otherwise directed):   On the Night Before the Test: . Be sure to Drink plenty of water. . Do not consume any caffeinated/decaffeinated beverages or chocolate 12 hours prior to your test. . Do not take any antihistamines 12 hours prior to your test.  On the Day of the Test: . Drink plenty of water. Do not drink any water within one hour of the test. . Do not eat any food 4 hours prior to the test. . You may take your regular medications prior to the test.  . Take metoprolol (Lopressor) 100MG  two hours prior to test. . HOLD YOUR MECLIZINE PRIOR TO TEST . FEMALES- please wear underwire-free bra if available   *For Clinical Staff only. Please instruct patient the following:*        -Drink plenty of water       -HOLD MECLIZINE       -Take metoprolol (Lopressor) 2 hours prior  to test (if applicable).        After the Test: . Drink plenty of water. . After receiving IV contrast, you may experience a mild flushed feeling. This is normal. . On occasion, you may experience a mild rash up  to 24 hours after the test. This is not dangerous. If this occurs, you can take Benadryl 25 mg and increase your fluid intake. . If you experience trouble breathing, this can be serious. If it is severe call 911 IMMEDIATELY. If it is mild, please call our office. . If you take any of these medications: Glipizide/Metformin, Avandament, Glucavance, please do not take 48 hours after completing test unless otherwise instructed.   Once we have confirmed authorization from your insurance company, we will call you to set up a date and time for your test.   For non-scheduling related questions, please contact the cardiac imaging nurse navigator should you have any questions/concerns: Marchia Bond, RN Navigator Cardiac Imaging [facility] [phone] office  For scheduling needs, including cancellations and rescheduling, please call [phone].        Signed, Donato Heinz, MD  10/01/2019 6:13 PM    [facility]

## 2019-09-30 NOTE — Patient Instructions (Addendum)
Medication Instructions:  BEGIN TAKING ATORVASTATIN 40MG  DAILY  ONE TIME DOES OF METOPROLOL TARTRATE 100MG  TO BE TAKEN 2 HOURS PRIOR TO CORONARY CT  *If you need a refill on your cardiac medications before your next appointment, please call your pharmacy*   Lab Work: BMET, 2 [place]  If you have labs (blood work) drawn today and your tests are completely normal, you will receive your results only by: Marland Kitchen MyChart Message (if you have MyChart) OR . A paper copy in the mail If you have any lab test that is abnormal or we need to change your treatment, we will call you to review the results.   Testing/Procedures: CORONARY CT- SEE INSTRUCTIONS BELOW   Follow-Up: At [facility], you and your health needs are our priority.  As part of our continuing mission to provide you with exceptional heart care, we have created designated Provider Care Teams.  These Care Teams include your primary Cardiologist (physician) and Advanced Practice Providers (APPs -  Physician Assistants and Nurse Practitioners) who all work together to provide you with the care you need, when you need it.  We recommend signing up for the patient portal called "MyChart".  Sign up information is provided on this After Visit Summary.  MyChart is used to connect with patients for Virtual Visits (Telemedicine).  Patients are able to view lab/test results, encounter notes, upcoming appointments, etc.  Non-urgent messages can be sent to your provider as well.   To learn more about what you can do with MyChart, go to [email or web address].    Your next appointment:   3 month(s)  The format for your next appointment:   In Person  Provider:   Oswaldo Milian, MD   Other Instructions  Your cardiac CT will be scheduled at one of the below locations:   [facility] [address] [place], [place] [address] [phone]  If scheduled at [facility], please arrive at the [facility] main entrance of [facility] 30 minutes prior to test start time. Proceed to the [facility] Radiology Department (first floor) to check-in and test prep.   Please follow these instructions carefully (unless otherwise directed):   On the Night Before the Test: . Be sure to Drink plenty of water. . Do not consume any caffeinated/decaffeinated beverages or chocolate 12 hours prior to your test. . Do not take any antihistamines 12 hours prior to your test.  On the Day of the Test: . Drink plenty of water. Do not drink any water within one hour of the test. . Do not eat any food 4 hours prior to the test. . You may take your regular medications prior to the test.  . Take metoprolol (Lopressor) 100MG  two hours prior to test. . HOLD YOUR MECLIZINE PRIOR TO TEST . FEMALES- please wear underwire-free bra if available   *For Clinical Staff only. Please instruct patient the following:*        -Drink plenty of water       -HOLD MECLIZINE       -Take metoprolol (Lopressor) 2 hours prior to test (if applicable).        After the Test: . Drink plenty of water. . After receiving IV contrast, you may experience a mild flushed feeling. This is normal. . On occasion, you may experience a mild rash up to 24 hours after the test. This is not dangerous. If this occurs, you can take Benadryl 25 mg and increase  your fluid intake. . If you experience trouble breathing, this can be serious. If it is severe call 911 IMMEDIATELY. If it is mild, please call our office. . If you take any of these medications: Glipizide/Metformin, Avandament, Glucavance, please do not take 48 hours after completing test unless otherwise instructed.   Once we have confirmed authorization from your insurance company, we will call you to set up a date and time for your test.   For non-scheduling related questions, please contact the cardiac imaging nurse navigator should you have any questions/concerns: Marchia Bond, RN Navigator Cardiac Imaging [facility] [phone] office  For scheduling needs, including cancellations and rescheduling, please call [phone].

## 2019-10-20 LAB — BASIC METABOLIC PANEL
BUN/Creatinine Ratio: 12 (ref 9–23)
BUN: 13 mg/dL (ref 6–24)
CO2: 20 mmol/L (ref 20–29)
Calcium: 10 mg/dL (ref 8.7–10.2)
Chloride: 102 mmol/L (ref 96–106)
Creatinine, Ser: 1.05 mg/dL — ABNORMAL HIGH (ref 0.57–1.00)
GFR calc Af Amer: 71 mL/min/{1.73_m2} (ref >59)
GFR calc non Af Amer: 62 mL/min/{1.73_m2} (ref >59)
Glucose: 78 mg/dL (ref 65–99)
Potassium: 4.2 mmol/L (ref 3.5–5.2)
Sodium: 139 mmol/L (ref 134–144)

## 2019-10-23 ENCOUNTER — Telehealth (HOSPITAL_COMMUNITY): Payer: Self-pay | Admitting: Emergency Medicine

## 2019-10-23 NOTE — Telephone Encounter (Signed)
Reaching out to patient to offer assistance regarding upcoming cardiac imaging study; pt verbalizes understanding of appt date/time, parking situation and where to check in, pre-test NPO status and medications ordered, and verified current allergies; name and call back number provided for further questions should they arise Kanoe Wanner RN Navigator Cardiac Imaging [facility] [phone] office [phone] cell 

## 2019-10-26 ENCOUNTER — Ambulatory Visit (HOSPITAL_COMMUNITY)
Admission: RE | Admit: 2019-10-26 | Discharge: 2019-10-26 | Disposition: A | Discharge status: Home / Self Care | Payer: BC Managed Care – PPO | Source: Ambulatory Visit | Attending: Cardiology | Admitting: Cardiology

## 2019-10-26 ENCOUNTER — Encounter: Payer: BC Managed Care – PPO | Admitting: *Deleted

## 2019-10-26 ENCOUNTER — Other Ambulatory Visit: Payer: Self-pay

## 2019-10-26 DIAGNOSIS — R072 Precordial pain: Secondary | ICD-10-CM | POA: Diagnosis present

## 2019-10-26 DIAGNOSIS — Z006 Encounter for examination for normal comparison and control in clinical research program: Secondary | ICD-10-CM

## 2019-10-26 MED ORDER — NITROGLYCERIN 0.4 MG SL SUBL: 0.8000 mg | SUBLINGUAL_TABLET | Freq: Once | SUBLINGUAL | Status: AC

## 2019-10-26 MED ORDER — NITROGLYCERIN 0.4 MG SL SUBL
SUBLINGUAL_TABLET | SUBLINGUAL | Status: AC
  Administered 2019-10-26: 0.8 mg via SUBLINGUAL
  Filled 2019-10-26: qty 2

## 2019-10-26 MED ORDER — IOHEXOL 350 MG/ML SOLN
80.0000 mL | Freq: Once | INTRAVENOUS | Status: AC | PRN
  Administered 2019-10-26: 80 mL via INTRAVENOUS

## 2019-10-26 NOTE — Research (Signed)
CADFEM Informed Consent                  Subject Name:   Becky Moore   Subject met inclusion and exclusion criteria.  The informed consent form, study requirements and expectations were reviewed with the subject and questions and concerns were addressed prior to the signing of the consent form.  The subject verbalized understanding of the trial requirements.  The subject agreed to participate in the CADFEM trial and signed the informed consent.  The informed consent was obtained prior to performance of any protocol-specific procedures for the subject.  A copy of the signed informed consent was given to the subject and a copy was placed in the subject's medical record.   [place] Layan Zalenski, Research Assistant  10/26/2019 15:17

## 2019-11-06 ENCOUNTER — Ambulatory Visit (AMBULATORY_SURGERY_CENTER): Payer: Self-pay | Admitting: *Deleted

## 2019-11-06 ENCOUNTER — Other Ambulatory Visit: Payer: Self-pay

## 2019-11-06 VITALS — Temp 97.1°F | Ht 65.0 in | Wt 193.0 lb

## 2019-11-06 DIAGNOSIS — Z86010 Personal history of colonic polyps: Secondary | ICD-10-CM

## 2019-11-06 DIAGNOSIS — Z01818 Encounter for other preprocedural examination: Secondary | ICD-10-CM

## 2019-11-06 MED ORDER — SUPREP BOWEL PREP KIT 17.5-3.13-1.6 GM/177ML PO SOLN: 1.0000 | Freq: Once | ORAL | 0 refills | Status: AC

## 2019-11-06 NOTE — Progress Notes (Signed)
No egg or soy allergy known to patient  No issues with past sedation with any surgeries  or procedures, no intubation problems  No diet pills per patient No home 02 use per patient  No blood thinners per patient  Pt denies issues with constipation  No A fib or A flutter  EMMI video sent to pt's e mail   suprep  code to pharmacy- coupon to pt   Due to the COVID-19 pandemic we are asking patients to follow these guidelines. Please only bring one care partner. Please be aware that your care partner may wait in the car in the parking lot or if they feel like they will be too hot to wait in the car, they may wait in the lobby on the 4th floor. All care partners are required to wear a mask the entire time (we do not have any that we can provide them), they need to practice social distancing, and we will do a Covid check for all patient's and care partners when you arrive. Also we will check their temperature and your temperature. If the care partner waits in their car they need to stay in the parking lot the entire time and we will call them on their cell phone when the patient is ready for discharge so they can bring the car to the front of the building. Also all patient's will need to wear a mask into building.

## 2019-11-16 ENCOUNTER — Encounter: Payer: Self-pay | Admitting: Gastroenterology

## 2019-11-17 ENCOUNTER — Ambulatory Visit (INDEPENDENT_AMBULATORY_CARE_PROVIDER_SITE_OTHER): Payer: BC Managed Care – PPO

## 2019-11-17 ENCOUNTER — Other Ambulatory Visit: Payer: Self-pay

## 2019-11-17 ENCOUNTER — Other Ambulatory Visit: Payer: Self-pay | Admitting: Gastroenterology

## 2019-11-17 DIAGNOSIS — Z1159 Encounter for screening for other viral diseases: Secondary | ICD-10-CM

## 2019-11-18 LAB — SARS CORONAVIRUS 2 (TAT 6-24 HRS): SARS Coronavirus 2: NEGATIVE

## 2019-11-20 ENCOUNTER — Other Ambulatory Visit: Payer: Self-pay

## 2019-11-20 ENCOUNTER — Ambulatory Visit (AMBULATORY_SURGERY_CENTER): Payer: BC Managed Care – PPO | Admitting: Gastroenterology

## 2019-11-20 ENCOUNTER — Encounter: Payer: Self-pay | Admitting: Gastroenterology

## 2019-11-20 VITALS — BP 92/53 | HR 57 | Temp 96.9°F | Resp 17 | Ht 65.0 in | Wt 193.0 lb

## 2019-11-20 DIAGNOSIS — K621 Rectal polyp: Secondary | ICD-10-CM | POA: Diagnosis not present

## 2019-11-20 DIAGNOSIS — D122 Benign neoplasm of ascending colon: Secondary | ICD-10-CM

## 2019-11-20 DIAGNOSIS — D129 Benign neoplasm of anus and anal canal: Secondary | ICD-10-CM

## 2019-11-20 DIAGNOSIS — K635 Polyp of colon: Secondary | ICD-10-CM

## 2019-11-20 DIAGNOSIS — D124 Benign neoplasm of descending colon: Secondary | ICD-10-CM

## 2019-11-20 DIAGNOSIS — Z86010 Personal history of colonic polyps: Secondary | ICD-10-CM | POA: Diagnosis not present

## 2019-11-20 MED ORDER — SODIUM CHLORIDE 0.9 % IV SOLN: 500.0000 mL | Freq: Once | INTRAVENOUS | Status: DC

## 2019-11-20 NOTE — Op Note (Signed)
[place] Patient Name: Becky Moore Procedure Date: 11/20/2019 9:10 AM MRN: [id number] Endoscopist: Mauri Pole , MD Age: 49 Referring MD:  Date of Birth: [birth year] Gender: Female Account #: [id number] Procedure:                Colonoscopy Indications:              High risk colon cancer surveillance: Personal                            history of colonic polyps, High risk colon cancer                            surveillance: Personal history of adenoma (10 mm or                            greater in size), High risk colon cancer                            surveillance: Personal history of multiple (3 or                            more) adenomas Medicines:                Monitored Anesthesia Care Procedure:                Pre-Anesthesia Assessment:                           - Prior to the procedure, a History and Physical                            was performed, and patient medications and                            allergies were reviewed. The patient's tolerance of                            previous anesthesia was also reviewed. The risks                            and benefits of the procedure and the sedation                            options and risks were discussed with the patient.                            All questions were answered, and informed consent                            was obtained. Prior Anticoagulants: The patient has                            taken no previous anticoagulant or antiplatelet  agents. ASA Grade Assessment: II - A patient with                            mild systemic disease. After reviewing the risks                            and benefits, the patient was deemed in                            satisfactory condition to undergo the procedure.                           After obtaining informed consent, the colonoscope                            was passed under direct vision. Throughout the                         procedure, the patient's blood pressure, pulse, and                            oxygen saturations were monitored continuously. The                            Colonoscope was introduced through the anus and                            advanced to the the cecum, identified by                            appendiceal orifice and ileocecal valve. The                            colonoscopy was performed without difficulty. The                            patient tolerated the procedure well. The quality                            of the bowel preparation was excellent. The                            ileocecal valve, appendiceal orifice, and rectum                            were photographed. Scope In: 9:13:12 AM Scope Out: 9:28:14 AM Scope Withdrawal Time: 0 hours 11 minutes 22 seconds  Total Procedure Duration: 0 hours 15 minutes 2 seconds  Findings:                 The perianal and digital rectal examinations were                            normal.  Three sessile polyps were found in the rectum,                            sigmoid colon and ascending colon. The polyps were                            3 to 11 mm in size. These polyps were removed with                            a cold snare. Resection and retrieval were complete.                           Non-bleeding internal hemorrhoids were found during                            retroflexion. The hemorrhoids were small. Complications:            No immediate complications. Estimated Blood Loss:     Estimated blood loss was minimal. Impression:               - Three 3 to 11 mm polyps in the rectum, in the                            sigmoid colon and in the ascending colon, removed                            with a cold snare. Resected and retrieved.                           - Non-bleeding internal hemorrhoids. Recommendation:           - Patient has a contact number available for                             emergencies. The signs and symptoms of potential                            delayed complications were discussed with the                            patient. Return to normal activities tomorrow.                            Written discharge instructions were provided to the                            patient.                           - Resume previous diet.                           - Continue present medications.                           -  Await pathology results.                           - Repeat colonoscopy in 3 - 5 years for                            surveillance based on pathology results. Mauri Pole, MD 11/20/2019 9:33:11 AM This report has been signed electronically.

## 2019-11-20 NOTE — Progress Notes (Signed)
Report to PACU, RN, vss, BBS= Clear.  

## 2019-11-20 NOTE — Progress Notes (Signed)
Called to room to assist during endoscopic procedure.  Patient ID and intended procedure confirmed with present staff. Received instructions for my participation in the procedure from the performing physician.  

## 2019-11-20 NOTE — Patient Instructions (Signed)
Handouts provided:  Polyps  YOU HAD AN ENDOSCOPIC PROCEDURE TODAY AT THE [facility]:   Refer to the procedure report that was given to you for any specific questions about what was found during the examination.  If the procedure report does not answer your questions, please call your gastroenterologist to clarify.  If you requested that your care partner not be given the details of your procedure findings, then the procedure report has been included in a sealed envelope for you to review at your convenience later.  YOU SHOULD EXPECT: Some feelings of bloating in the abdomen. Passage of more gas than usual.  Walking can help get rid of the air that was put into your GI tract during the procedure and reduce the bloating. If you had a lower endoscopy (such as a colonoscopy or flexible sigmoidoscopy) you may notice spotting of blood in your stool or on the toilet paper. If you underwent a bowel prep for your procedure, you may not have a normal bowel movement for a few days.  Please Note:  You might notice some irritation and congestion in your nose or some drainage.  This is from the oxygen used during your procedure.  There is no need for concern and it should clear up in a day or so.  SYMPTOMS TO REPORT IMMEDIATELY:   Following lower endoscopy (colonoscopy or flexible sigmoidoscopy):  Excessive amounts of blood in the stool  Significant tenderness or worsening of abdominal pains  Swelling of the abdomen that is new, acute  Fever of 100F or higher  For urgent or emergent issues, a gastroenterologist can be reached at any hour by calling [phone]. Do not use MyChart messaging for urgent concerns.    DIET:  We do recommend a small meal at first, but then you may proceed to your regular diet.  Drink plenty of fluids but you should avoid alcoholic beverages for 24 hours.  ACTIVITY:  You should plan to take it easy for the rest of today and you should NOT DRIVE or use heavy  machinery until tomorrow (because of the sedation medicines used during the test).    FOLLOW UP: Our staff will call the number listed on your records 48-72 hours following your procedure to check on you and address any questions or concerns that you may have regarding the information given to you following your procedure. If we do not reach you, we will leave a message.  We will attempt to reach you two times.  During this call, we will ask if you have developed any symptoms of COVID 19. If you develop any symptoms (ie: fever, flu-like symptoms, shortness of breath, cough etc.) before then, please call [phone].  If you test positive for Covid 19 in the 2 weeks post procedure, please call and report this information to us.    If any biopsies were taken you will be contacted by phone or by letter within the next 1-3 weeks.  Please call us at [phone] if you have not heard about the biopsies in 3 weeks.    SIGNATURES/CONFIDENTIALITY: You and/or your care partner have signed paperwork which will be entered into your electronic medical record.  These signatures attest to the fact that that the information above on your After Visit Summary has been reviewed and is understood.  Full responsibility of the confidentiality of this discharge information lies with you and/or your care-partner.  

## 2019-11-24 ENCOUNTER — Telehealth: Payer: Self-pay

## 2019-11-24 NOTE — Telephone Encounter (Signed)
  Follow up Call-  Call back number 11/20/2019  Post procedure Call Back phone  # [phone]  Permission to leave phone message Yes  Some recent data might be hidden     Patient questions:  Do you have a fever, pain , or abdominal swelling? No. Pain Score  0 *  Have you tolerated food without any problems? Yes.    Have you been able to return to your normal activities? Yes.    Do you have any questions about your discharge instructions: Diet   No. Medications  No. Follow up visit  No.  Do you have questions or concerns about your Care? No.  Actions: * If pain score is 4 or above: No action needed, pain <4.  1. Have you developed a fever since your procedure? no  2.   Have you had an respiratory symptoms (SOB or cough) since your procedure? no 3.   Have you tested positive for COVID 19 since your procedure no  4.   Have you had any family members/close contacts diagnosed with the COVID 19 since your procedure?  no   If yes to any of these questions please route to Joylene John, RN and Erenest Rasher, RN

## 2019-12-03 ENCOUNTER — Encounter: Payer: Self-pay | Admitting: Gastroenterology

## 2019-12-23 ENCOUNTER — Ambulatory Visit (INDEPENDENT_AMBULATORY_CARE_PROVIDER_SITE_OTHER): Payer: BC Managed Care – PPO | Admitting: Cardiology

## 2019-12-23 ENCOUNTER — Encounter: Payer: Self-pay | Admitting: Cardiology

## 2019-12-23 ENCOUNTER — Other Ambulatory Visit: Payer: Self-pay

## 2019-12-23 VITALS — BP 128/82 | HR 77 | Ht 65.0 in | Wt 195.4 lb

## 2019-12-23 DIAGNOSIS — I2510 Atherosclerotic heart disease of native coronary artery without angina pectoris: Secondary | ICD-10-CM | POA: Diagnosis not present

## 2019-12-23 DIAGNOSIS — I6529 Occlusion and stenosis of unspecified carotid artery: Secondary | ICD-10-CM | POA: Diagnosis not present

## 2019-12-23 DIAGNOSIS — R42 Dizziness and giddiness: Secondary | ICD-10-CM | POA: Diagnosis not present

## 2019-12-23 DIAGNOSIS — E785 Hyperlipidemia, unspecified: Secondary | ICD-10-CM

## 2019-12-23 NOTE — Progress Notes (Signed)
Cardiology Office Note:    Date:  12/23/2019   ID:  Becky Moore, DOB October 15, 1970, MRN [id number]  PCP:  Hayden Rasmussen, MD  Cardiologist:  No primary care provider on file.  Electrophysiologist:  None   Referring MD: Hayden Rasmussen, MD   Chief Complaint  Patient presents with  . Coronary Artery Disease    History of Present Illness:    Becky Moore is a 49 y.o. female with a hx of hyperlipdemia, prediabetes who presents for follow-up.  She was referred by Dr. Volanda Napoleon for an evaluation of cardiovascular risk assessment, initially seen on 07/15/2019.  She had recently been started on lovastatin but did not want to start taking medication.  Her ASCVD risk score was low, so calcium score was recommended for further risk stratification to see if statin was indicated.  Calcium score on 07/22/2019 with 88 (97th percentile).   TTE in 06/16/15 shows Normal LV systolic function; grade 1 diastolic dysfunction; trace MR and TR; atrial septal aneurysm.  Calcium score on 07/22/2019 with 88 (97th percentile).  Coronary CTA on 10/26/2019 showed nonobstructive CAD with calcified plaque in the proximal LAD causing mild (25 to 49%) stenosis.  Since last clinic visit, she reports that she has been doing well.  She denies any chest pain or dyspnea.  Does report she has been having lightheadedness, though has vertigo.  She denies any syncope or palpitations.    Past Medical History:  Diagnosis Date  . GERD (gastroesophageal reflux disease)    Nissen corrected   . Hyperlipidemia   . Vertigo     Past Surgical History:  Procedure Laterality Date  . CHOLECYSTECTOMY    . COLONOSCOPY    . HERNIA REPAIR    . POLYPECTOMY    . WISDOM TOOTH EXTRACTION      Current Medications: Current Meds  Medication Sig  . atorvastatin (LIPITOR) 40 MG tablet Take 1 tablet (40 mg total) by mouth daily.  . bimatoprost (LATISSE) 0.03 % ophthalmic solution SMARTSIG:1 Application In Eye(s) Daily  . finasteride  (PROSCAR) 5 MG tablet Take 5 mg by mouth daily.  . fluticasone (FLONASE) 50 MCG/ACT nasal spray Place 2 sprays into both nostrils daily.  . Meclizine HCl 25 MG CHEW Chew 1 tablet (25 mg total) by mouth 4 (four) times daily as needed.  . metoprolol tartrate (LOPRESSOR) 100 MG tablet Take 1 tablet (100 mg total) by mouth once for 1 dose. TAKE 2 HOURS PRIOR TO CORONARY CT  . promethazine (PHENERGAN) 25 MG tablet Take 1 tablet (25 mg total) by mouth every 6 (six) hours as needed for nausea or vomiting.  . Varenicline Tartrate (CHANTIX PO) Take 1 tablet by mouth daily.  Marland Kitchen VITAMIN D, CHOLECALCIFEROL, PO Take 1 tablet by mouth daily.     Allergies:   Patient has no known allergies.   Social History   Socioeconomic History  . Marital status: Single    Spouse name: Not on file  . Number of children: Not on file  . Years of education: Not on file  . Highest education level: Not on file  Occupational History  . Not on file  Tobacco Use  . Smoking status: Former Smoker    Packs/day: 0.25    Types: Cigarettes    Quit date: 03/22/2015    Years since quitting: 4.7  . Smokeless tobacco: Never Used  . Tobacco comment: on Chantix x 1 month  Substance and Sexual Activity  . Alcohol use: Yes  Alcohol/week: 0.0 standard drinks    Comment: once a month  . Drug use: No  . Sexual activity: Yes    Birth control/protection: None  Other Topics Concern  . Not on file  Social History Narrative   Some college    Social Determinants of Health   Financial Resource Strain:   . Difficulty of Paying Living Expenses:   Food Insecurity:   . Worried About Charity fundraiser in the Last Year:   . Arboriculturist in the Last Year:   Transportation Needs:   . Film/video editor (Medical):   Marland Kitchen Lack of Transportation (Non-Medical):   Physical Activity:   . Days of Exercise per Week:   . Minutes of Exercise per Session:   Stress:   . Feeling of Stress :   Social Connections:   . Frequency of  Communication with Friends and Family:   . Frequency of Social Gatherings with Friends and Family:   . Attends Religious Services:   . Active Member of Clubs or Organizations:   . Attends Archivist Meetings:   Marland Kitchen Marital Status:      Family History: The patient's *family history includes Colon polyps in her mother and sister. There is no history of Colon cancer, Esophageal cancer, Rectal cancer, or Stomach cancer.  ROS:   Please see the history of present illness.     All other systems reviewed and are negative.  EKGs/Labs/Other Studies Reviewed:    The following studies were reviewed today:   EKG:  EKG is not ordered today.  The ekg ordered most recently demonstrates normal sinus rhythm, rate 69, no ST/T abnormalities  Recent Labs: 10/19/2019: BUN 13; Creatinine, Ser 1.05; Potassium 4.2; Sodium 139  Recent Lipid Panel    Component Value Date/Time   CHOL 226 (H) 11/14/2017 0921   TRIG 98 11/14/2017 0921   HDL 54 11/14/2017 0921   CHOLHDL 4.2 11/14/2017 0921   VLDL 22.8 10/01/2016 1528   LDLCALC 151 (H) 11/14/2017 0921    Physical Exam:    VS:  BP 128/82   Pulse 77   Ht 5\' 5"  (1.651 m)   Wt 195 lb 6.4 oz (88.6 kg)   SpO2 98%   BMI 32.52 kg/m     Wt Readings from Last 3 Encounters:  12/23/19 195 lb 6.4 oz (88.6 kg)  11/20/19 193 lb (87.5 kg)  11/06/19 193 lb (87.5 kg)     GEN:  Well nourished, well developed in no acute distress HEENT: Normal NECK: No JVD; No carotid bruits LYMPHATICS: No lymphadenopathy CARDIAC: RRR, no murmurs, rubs, gallops RESPIRATORY:  Clear to auscultation without rales, wheezing or rhonchi  ABDOMEN: Soft, non-tender, non-distended MUSCULOSKELETAL:  No edema; No deformity  SKIN: Warm and dry NEUROLOGIC:  Alert and oriented x 3 PSYCHIATRIC:  Normal affect   ASSESSMENT:    1. Coronary artery disease involving native coronary artery of native heart without angina pectoris   2. Stenosis of carotid artery, unspecified  laterality   3. Hyperlipidemia, unspecified hyperlipidemia type   4. Lightheadedness    PLAN:    CAD:  Calcium score on 07/22/2019 was 88 (97th percentile).  Coronary CTA on 10/26/2019 showed nonobstructive CAD with calcified plaque in the proximal LAD causing mild (25 to 49%) stenosis. -Continue atorvastatin 40 mg daily.  Will recheck lipid panel  Hyperlipidemia: LDL 146 on 07/07/2019.  Calcium score 88 (97th percentile).  She started atorvastatin 40 mg daily in March 2021.  Will recheck  lipid panel.  Lightheadedness: Suspect related to vertigo.  Did have minor carotid atherosclerosis on the last carotid duplex in December 2016.  Will repeat carotid duplex.  Prediabetes: A1c 5.7.    RTC in 3 months   Medication Adjustments/Labs and Tests Ordered: Current medicines are reviewed at length with the patient today.  Concerns regarding medicines are outlined above.  Orders Placed This Encounter  Procedures  . Lipid panel  . VAS US CAROTID   No orders of the defined types were placed in this encounter.   Patient Instructions  Medication Instructions:  Your physician recommends that you continue on your current medications as directed. Please refer to the Current Medication list given to you today.  *If you need a refill on your cardiac medications before your next appointment, please call your pharmacy*  Lab Work: Lipid panel today  If you have labs (blood work) drawn today and your tests are completely normal, you will receive your results only by: Marland Kitchen MyChart Message (if you have MyChart) OR . A paper copy in the mail If you have any lab test that is abnormal or we need to change your treatment, we will call you to review the results.   Testing/Procedures: Your physician has requested that you have a carotid duplex. This test is an ultrasound of the carotid arteries in your neck. It looks at blood flow through these arteries that supply the brain with blood. Allow one hour for this  exam. There are no restrictions or special instructions.  Follow-Up: At [facility], you and your health needs are our priority.  As part of our continuing mission to provide you with exceptional heart care, we have created designated Provider Care Teams.  These Care Teams include your primary Cardiologist (physician) and Advanced Practice Providers (APPs -  Physician Assistants and Nurse Practitioners) who all work together to provide you with the care you need, when you need it.  We recommend signing up for the patient portal called "MyChart".  Sign up information is provided on this After Visit Summary.  MyChart is used to connect with patients for Virtual Visits (Telemedicine).  Patients are able to view lab/test results, encounter notes, upcoming appointments, etc.  Non-urgent messages can be sent to your provider as well.   To learn more about what you can do with MyChart, go to [email or web address].    Your next appointment:   6 month(s)  The format for your next appointment:   In Person  Provider:   Oswaldo Milian, MD        Signed, Donato Heinz, MD  12/23/2019 3:21 PM    [place]

## 2019-12-23 NOTE — Patient Instructions (Signed)
Medication Instructions:  Your physician recommends that you continue on your current medications as directed. Please refer to the Current Medication list given to you today.  *If you need a refill on your cardiac medications before your next appointment, please call your pharmacy*  Lab Work: Lipid panel today  If you have labs (blood work) drawn today and your tests are completely normal, you will receive your results only by: Marland Kitchen MyChart Message (if you have MyChart) OR . A paper copy in the mail If you have any lab test that is abnormal or we need to change your treatment, we will call you to review the results.   Testing/Procedures: Your physician has requested that you have a carotid duplex. This test is an ultrasound of the carotid arteries in your neck. It looks at blood flow through these arteries that supply the brain with blood. Allow one hour for this exam. There are no restrictions or special instructions.  Follow-Up: At [facility], you and your health needs are our priority.  As part of our continuing mission to provide you with exceptional heart care, we have created designated Provider Care Teams.  These Care Teams include your primary Cardiologist (physician) and Advanced Practice Providers (APPs -  Physician Assistants and Nurse Practitioners) who all work together to provide you with the care you need, when you need it.  We recommend signing up for the patient portal called "MyChart".  Sign up information is provided on this After Visit Summary.  MyChart is used to connect with patients for Virtual Visits (Telemedicine).  Patients are able to view lab/test results, encounter notes, upcoming appointments, etc.  Non-urgent messages can be sent to your provider as well.   To learn more about what you can do with MyChart, go to [email or web address].    Your next appointment:   6 month(s)  The format for your next appointment:   In Person  Provider:   Oswaldo Milian, MD

## 2019-12-24 LAB — LIPID PANEL
Chol/HDL Ratio: 2.5 ratio (ref 0.0–4.4)
Cholesterol, Total: 127 mg/dL (ref 100–199)
HDL: 50 mg/dL (ref >39)
LDL Chol Calc (NIH): 60 mg/dL (ref 0–99)
Triglycerides: 88 mg/dL (ref 0–149)
VLDL Cholesterol Cal: 17 mg/dL (ref 5–40)

## 2020-01-08 ENCOUNTER — Other Ambulatory Visit: Payer: Self-pay

## 2020-01-08 ENCOUNTER — Ambulatory Visit (HOSPITAL_COMMUNITY)
Admission: RE | Admit: 2020-01-08 | Discharge: 2020-01-08 | Disposition: A | Discharge status: Home / Self Care | Payer: BC Managed Care – PPO | Source: Ambulatory Visit | Attending: Cardiovascular Disease | Admitting: Cardiovascular Disease

## 2020-01-08 DIAGNOSIS — I6529 Occlusion and stenosis of unspecified carotid artery: Secondary | ICD-10-CM | POA: Diagnosis not present

## 2020-06-10 ENCOUNTER — Encounter: Payer: Self-pay | Admitting: Cardiology

## 2020-06-10 ENCOUNTER — Other Ambulatory Visit: Payer: Self-pay

## 2020-06-10 ENCOUNTER — Ambulatory Visit (INDEPENDENT_AMBULATORY_CARE_PROVIDER_SITE_OTHER): Payer: BC Managed Care – PPO | Admitting: Cardiology

## 2020-06-10 VITALS — BP 128/80 | HR 71 | Ht 65.0 in | Wt 205.0 lb

## 2020-06-10 DIAGNOSIS — I2510 Atherosclerotic heart disease of native coronary artery without angina pectoris: Secondary | ICD-10-CM | POA: Diagnosis not present

## 2020-06-10 DIAGNOSIS — R42 Dizziness and giddiness: Secondary | ICD-10-CM

## 2020-06-10 DIAGNOSIS — E785 Hyperlipidemia, unspecified: Secondary | ICD-10-CM | POA: Diagnosis not present

## 2020-06-10 NOTE — Progress Notes (Signed)
Cardiology Office Note:    Date:  06/10/2020   ID:  Becky Moore, DOB [birth year], MRN [id number]  PCP:  Hayden Rasmussen, MD  Cardiologist:  No primary care provider on file.  Electrophysiologist:  None   Referring MD: Hayden Rasmussen, MD   Chief Complaint  Patient presents with  . Coronary Artery Disease    History of Present Illness:    Becky Moore is a 49 y.o. female with a hx of hyperlipdemia, prediabetes who presents for follow-up.  She was referred by Dr. Volanda Napoleon for an evaluation of cardiovascular risk assessment, initially seen on 07/15/2019.  She had recently been started on lovastatin but did not want to start taking medication.  Her ASCVD risk score was low, so calcium score was recommended for further risk stratification to see if statin was indicated.  Calcium score on 07/22/2019 with 88 (97th percentile).   TTE in 06/16/15 shows Normal LV systolic function; grade 1 diastolic dysfunction; trace MR and TR; atrial septal aneurysm.  Calcium score on 07/22/2019 with 88 (97th percentile).  Coronary CTA on 10/26/2019 showed nonobstructive CAD with calcified plaque in the proximal LAD causing mild (25 to 49%) stenosis.  Since last clinic visit, she reports that she has been doing okay.  She is currently on leave from work due to issues with back pain.  Denies any chest pain or dyspnea.  Reports she has been walking her dog twice per day for 20-25 minutes and denies any exertional symptoms.  Reports occasional lightheadedness but denies any syncope.  Denies any palpitations.    Past Medical History:  Diagnosis Date  . GERD (gastroesophageal reflux disease)    Nissen corrected   . Hyperlipidemia   . Vertigo     Past Surgical History:  Procedure Laterality Date  . CHOLECYSTECTOMY    . COLONOSCOPY    . HERNIA REPAIR    . POLYPECTOMY    . WISDOM TOOTH EXTRACTION      Current Medications: Current Meds  Medication Sig  . atorvastatin (LIPITOR) 40 MG tablet Take 1  tablet (40 mg total) by mouth daily.  . bimatoprost (LATISSE) 0.03 % ophthalmic solution SMARTSIG:1 Application In Eye(s) Daily  . finasteride (PROSCAR) 5 MG tablet Take 5 mg by mouth daily.  . fluticasone (FLONASE) 50 MCG/ACT nasal spray Place 2 sprays into both nostrils daily.  . Meclizine HCl 25 MG CHEW Chew 1 tablet (25 mg total) by mouth 4 (four) times daily as needed.  . promethazine (PHENERGAN) 25 MG tablet Take 1 tablet (25 mg total) by mouth every 6 (six) hours as needed for nausea or vomiting.  . Vitamin D, Ergocalciferol, (DRISDOL) 1.25 MG (50000 UNIT) CAPS capsule Take 50,000 Units by mouth once a week.     Allergies:   Patient has no known allergies.   Social History   Socioeconomic History  . Marital status: Single    Spouse name: Not on file  . Number of children: Not on file  . Years of education: Not on file  . Highest education level: Not on file  Occupational History  . Not on file  Tobacco Use  . Smoking status: Former Smoker    Packs/day: 0.25    Types: Cigarettes    Quit date: 03/22/2015    Years since quitting: 5.2  . Smokeless tobacco: Never Used  . Tobacco comment: on Chantix x 1 month  Substance and Sexual Activity  . Alcohol use: Yes    Alcohol/week: 0.0 standard drinks  Comment: once a month  . Drug use: No  . Sexual activity: Yes    Birth control/protection: None  Other Topics Concern  . Not on file  Social History Narrative   Some college    Social Determinants of Health   Financial Resource Strain: Not on file  Food Insecurity: Not on file  Transportation Needs: Not on file  Physical Activity: Not on file  Stress: Not on file  Social Connections: Not on file     Family History: The patient's *family history includes Colon polyps in her mother and sister. There is no history of Colon cancer, Esophageal cancer, Rectal cancer, or Stomach cancer.  ROS:   Please see the history of present illness.     All other systems reviewed and  are negative.  EKGs/Labs/Other Studies Reviewed:    The following studies were reviewed today:   EKG:  EKG is not ordered today.  The ekg ordered most recently demonstrates normal sinus rhythm, rate 69, no ST/T abnormalities  Recent Labs: 10/19/2019: BUN 13; Creatinine, Ser 1.05; Potassium 4.2; Sodium 139  Recent Lipid Panel    Component Value Date/Time   CHOL 127 12/23/2019 1522   TRIG 88 12/23/2019 1522   HDL 50 12/23/2019 1522   CHOLHDL 2.5 12/23/2019 1522   CHOLHDL 4.2 11/14/2017 0921   VLDL 22.8 10/01/2016 1528   LDLCALC 60 12/23/2019 1522   LDLCALC 151 (H) 11/14/2017 0921    Physical Exam:    VS:  BP 128/80   Pulse 71   Ht 5\' 5"  (1.651 m)   Wt 205 lb (93 kg)   SpO2 98%   BMI 34.11 kg/m     Wt Readings from Last 3 Encounters:  06/10/20 205 lb (93 kg)  12/23/19 195 lb 6.4 oz (88.6 kg)  11/20/19 193 lb (87.5 kg)     GEN:  Well nourished, well developed in no acute distress HEENT: Normal NECK: No JVD; No carotid bruits LYMPHATICS: No lymphadenopathy CARDIAC: RRR, no murmurs, rubs, gallops RESPIRATORY:  Clear to auscultation without rales, wheezing or rhonchi  ABDOMEN: Soft, non-tender, non-distended MUSCULOSKELETAL:  No edema; No deformity  SKIN: Warm and dry NEUROLOGIC:  Alert and oriented x 3 PSYCHIATRIC:  Normal affect   ASSESSMENT:    1. Coronary artery disease involving native coronary artery of native heart without angina pectoris   2. Lightheadedness   3. Hyperlipidemia, unspecified hyperlipidemia type    PLAN:    CAD:  Calcium score on 07/22/2019 was 88 (97th percentile).  Coronary CTA on 10/26/2019 showed nonobstructive CAD with calcified plaque in the proximal LAD causing mild (25 to 49%) stenosis. -Continue atorvastatin 40 mg daily.    Hyperlipidemia: LDL 68 on 12/23/2019.  Continue atorvastatin 40 mg daily.  Lightheadedness: Suspect related to vertigo.  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    RTC in 1 year   Medication Adjustments/Labs and Tests Ordered: Current medicines are reviewed at length with the patient today.  Concerns regarding medicines are outlined above.  No orders of the defined types were placed in this encounter.  No orders of the defined types were placed in this encounter.   Patient Instructions  Medication Instructions:  Your physician recommends that you continue on your current medications as directed. Please refer to the Current Medication list given to you today.  *If you need a refill on your cardiac medications before your next appointment, please call your pharmacy*  Follow-Up: At  [facility], you and your health needs are our priority.  As part of our continuing mission to provide you with exceptional heart care, we have created designated Provider Care Teams.  These Care Teams include your primary Cardiologist (physician) and Advanced Practice Providers (APPs -  Physician Assistants and Nurse Practitioners) who all work together to provide you with the care you need, when you need it.  We recommend signing up for the patient portal called "MyChart".  Sign up information is provided on this After Visit Summary.  MyChart is used to connect with patients for Virtual Visits (Telemedicine).  Patients are able to view lab/test results, encounter notes, upcoming appointments, etc.  Non-urgent messages can be sent to your provider as well.   To learn more about what you can do with MyChart, go to [email or web address].    Your next appointment:   12 month(s)  The format for your next appointment:   In Person  Provider:   Oswaldo Milian, MD       Signed, Donato Heinz, MD  06/10/2020 9:00 PM    [place]

## 2020-06-10 NOTE — Patient Instructions (Signed)

## 2020-09-26 ENCOUNTER — Other Ambulatory Visit: Payer: Self-pay | Admitting: Cardiology

## 2021-06-18 ENCOUNTER — Other Ambulatory Visit: Payer: Self-pay | Admitting: Cardiology

## 2021-08-14 ENCOUNTER — Ambulatory Visit
Admission: EM | Admit: 2021-08-14 | Discharge: 2021-08-14 | Disposition: A | Discharge status: Home / Self Care | Payer: BC Managed Care – PPO | Attending: Student | Admitting: Student

## 2021-08-14 ENCOUNTER — Other Ambulatory Visit: Payer: Self-pay

## 2021-08-14 DIAGNOSIS — M654 Radial styloid tenosynovitis [de Quervain]: Secondary | ICD-10-CM | POA: Diagnosis not present

## 2021-08-14 MED ORDER — PREDNISONE 20 MG PO TABS: 40.0000 mg | ORAL_TABLET | Freq: Every day | ORAL | 0 refills | Status: AC

## 2021-08-14 NOTE — ED Triage Notes (Signed)
Pt c/o left wrist pain, she does not know the cause of pain.

## 2021-08-14 NOTE — ED Provider Notes (Signed)
[facility]    CSN: [id number] Arrival date & time: 08/14/21  1037      History   Chief Complaint Chief Complaint  Patient presents with   Wrist Pain    HPI Becky Moore is a 51 y.o. female presenting with left wrist pain, nontraumatic.  Medical history noncontributory.  Describes pain at the medial aspect of her left wrist for about 2 weeks.  Denies trauma but does endorse overuse as she recently started a new job at a [place] [employer].  States she purchased a wrist brace from [employer], this provided some relief but only when she wears it.  Denies radiation of the pain.  Denies sensation changes.  She is right-handed.  HPI  Past Medical History:  Diagnosis Date   GERD (gastroesophageal reflux disease)    Nissen corrected    Hyperlipidemia    Vertigo     Patient Active Problem List   Diagnosis Date Noted   Back pain 11/16/2016   Hx of adenomatous colonic polyps 10/01/2016   Dyslipidemia 10/01/2016   VERTIGO 12/30/2009   DYSPNEA 12/01/2009   HIATAL HERNIA 08/31/2009   CHOLECYSTECTOMY, LAPAROSCOPIC, HX OF 08/18/2009   DYSPHAGIA UNSPECIFIED 08/15/2009   SORE THROAT 12/08/2007   GERD 09/04/2007    Past Surgical History:  Procedure Laterality Date   CHOLECYSTECTOMY     COLONOSCOPY     HERNIA REPAIR     POLYPECTOMY     WISDOM TOOTH EXTRACTION      OB History   No obstetric history on file.      Home Medications    Prior to Admission medications   Medication Sig Start Date End Date Taking? Authorizing Provider  predniSONE (DELTASONE) 20 MG tablet Take 2 tablets (40 mg total) by mouth daily for 5 days. Take with breakfast or lunch. Avoid NSAIDs (ibuprofen, etc) while taking this medication. 08/14/21 08/19/21 Yes Hazel Sams, PA-C  atorvastatin (LIPITOR) 40 MG tablet Take 1 tablet (40 mg total) by mouth daily. **Patient need appointment for future refill** 06/19/21   Donato Heinz, MD  bimatoprost (LATISSE) 0.03 % ophthalmic solution SMARTSIG:1  Application In Eye(s) Daily 09/18/19   [provider]  finasteride (PROSCAR) 5 MG tablet Take 5 mg by mouth daily. 07/10/19   [provider]  fluticasone (FLONASE) 50 MCG/ACT nasal spray Place 2 sprays into both nostrils daily. 10/01/16   Marletta Lor, MD  Meclizine HCl 25 MG CHEW Chew 1 tablet (25 mg total) by mouth 4 (four) times daily as needed. 01/10/18   Billie Ruddy, MD  promethazine (PHENERGAN) 25 MG tablet Take 1 tablet (25 mg total) by mouth every 6 (six) hours as needed for nausea or vomiting. 10/12/16   Marletta Lor, MD  Vitamin D, Ergocalciferol, (DRISDOL) 1.25 MG (50000 UNIT) CAPS capsule Take 50,000 Units by mouth once a week. 03/12/20   [provider]    Family History Family History  Problem Relation Age of Onset   Colon polyps Mother    Colon polyps Sister    Colon cancer Neg Hx    Esophageal cancer Neg Hx    Rectal cancer Neg Hx    Stomach cancer Neg Hx     Social History Social History   Tobacco Use   Smoking status: Former    Packs/day: 0.25    Types: Cigarettes    Quit date: 03/22/2015    Years since quitting: 6.4   Smokeless tobacco: Never   Tobacco comments:  on Chantix x 1 month  Substance Use Topics   Alcohol use: Yes    Alcohol/week: 0.0 standard drinks    Comment: once a month   Drug use: No     Allergies   Patient has no known allergies.   Review of Systems Review of Systems  Musculoskeletal:        L wrist pain   All other systems reviewed and are negative.   Physical Exam Triage Vital Signs ED Triage Vitals  Enc Vitals Group     BP 08/14/21 1119 138/88     Pulse Rate 08/14/21 1119 68     Resp 08/14/21 1119 18     Temp 08/14/21 1119 97.7 F (36.5 C)     Temp Source 08/14/21 1119 Oral     SpO2 08/14/21 1119 98 %     Weight --      Height --      Head Circumference --      Peak Flow --      Pain Score 08/14/21 1118 10     Pain Loc --      Pain Edu? --      Excl. in [place]? --    No  data found.  Updated Vital Signs BP 138/88 (BP Location: Right Arm)    Pulse 68    Temp 97.7 F (36.5 C) (Oral)    Resp 18    SpO2 98%   Visual Acuity Right Eye Distance:   Left Eye Distance:   Bilateral Distance:    Right Eye Near:   Left Eye Near:    Bilateral Near:     Physical Exam Vitals reviewed.  Constitutional:      General: She is not in acute distress.    Appearance: Normal appearance. She is not ill-appearing.  HENT:     Head: Normocephalic and atraumatic.  Pulmonary:     Effort: Pulmonary effort is normal.  Musculoskeletal:     Comments: L wrist - TTP distal medial radius without skin changes or bony deformity. ROM wrist intact but with pain. Positive finkelstein. No snuffbox tenderness. Grip strength 5/5, sensation intact, cap refill <2 seconds, radial pulse 2+.  Neurological:     General: No focal deficit present.     Mental Status: She is alert and oriented to person, place, and time.  Psychiatric:        Mood and Affect: Mood normal.        Behavior: Behavior normal.        Thought Content: Thought content normal.        Judgment: Judgment normal.     UC Treatments / Results  Labs (all labs ordered are listed, but only abnormal results are displayed) Labs Reviewed - No data to display  EKG   Radiology No results found.  Procedures Procedures (including critical care time)  Medications Ordered in UC Medications - No data to display  Initial Impression / Assessment and Plan / UC Course  I have reviewed the triage vital signs and the nursing notes.  Pertinent labs & imaging results that were available during my care of the patient were reviewed by me and considered in my medical decision making (see chart for details).     This patient is a very pleasant 51 y.o. year old female presenting with L wrist - deQuervain's tenosynovitis.  Neurovascularly intact  No trauma.  She is in agreement to defer x-ray imaging at this time.  Wrist brace  provided, low-dose of  prednisone for 5 days.  Follow-up with Ortho if symptoms persist, information provided.  Final Clinical Impressions(s) / UC Diagnoses   Final diagnoses:  Tenosynovitis, de Quervain     Discharge Instructions      -Prednisone, 2 pills taken at the same time for 5 days in a row.  Try taking this earlier in the day as it can give you energy. Avoid NSAIDs like ibuprofen and alleve while taking this medication as they can increase your risk of stomach upset and even GI bleeding when in combination with a steroid. You can continue tylenol (acetaminophen) up to 1000mg  3x daily. -Wrist brace while pain persists -If symptoms persist in 7 days, follow-up with an orthopedist. I recommend [facility] at [address] [place], [place] [address]. You can schedule an appointment by calling [phone]) or online ([email or web address]), but they also have a walk-in clinic M-F 8a-8p and Sat 10a-3p.      ED Prescriptions     Medication Sig Dispense Auth. Provider   predniSONE (DELTASONE) 20 MG tablet Take 2 tablets (40 mg total) by mouth daily for 5 days. Take with breakfast or lunch. Avoid NSAIDs (ibuprofen, etc) while taking this medication. 10 tablet Hazel Sams, PA-C      PDMP not reviewed this encounter.   Hazel Sams, PA-C 08/14/21 1137

## 2021-08-14 NOTE — Discharge Instructions (Addendum)
-  Prednisone, 2 pills taken at the same time for 5 days in a row.  Try taking this earlier in the day as it can give you energy. Avoid NSAIDs like ibuprofen and alleve while taking this medication as they can increase your risk of stomach upset and even GI bleeding when in combination with a steroid. You can continue tylenol (acetaminophen) up to 1000mg  3x daily. -Wrist brace while pain persists -If symptoms persist in 7 days, follow-up with an orthopedist. I recommend [facility] at [address], [place], [place] [address]. You can schedule an appointment by calling [phone]) or online ([email or web address]), but they also have a walk-in clinic M-F 8a-8p and Sat 10a-3p.

## 2021-08-31 ENCOUNTER — Other Ambulatory Visit: Payer: Self-pay

## 2021-08-31 ENCOUNTER — Encounter: Payer: Self-pay | Admitting: Dietician

## 2021-08-31 ENCOUNTER — Encounter: Payer: BC Managed Care – PPO | Attending: Family Medicine | Admitting: Dietician

## 2021-08-31 DIAGNOSIS — E119 Type 2 diabetes mellitus without complications: Secondary | ICD-10-CM | POA: Diagnosis not present

## 2021-08-31 NOTE — Patient Instructions (Addendum)
Recipe Resources: ? Neldon Mc- you tube ? Well your world- you tube ? ?Mindfulness:   ? Choices ? Eat slowly ?Stop when satisfied ? ?Eat more Non-Starchy Vegetables ?These include greens, broccoli, cauliflower, cabbage, carrots, beets, eggplant, peppers, squash and others. ?Minimize added sugars and refined grains ?Rethink what you drink.  Choose beverages without added sugar.  Look for 0 carbs on the label. ?See the list of whole grains below.  Find alternatives to usual sweet treats. ?Choose whole foods over processed. ?Make simple meals at home more often than eating out. ? ?Tips to increase fiber in your diet: ?(All plants have fiber.  Eat a variety. There are more than are on this list.) ?Slowly increase the amount of fiber you eat to 25-35 grams per day.  (More is fine if you tolerate it.) ?Fiber from whole grains, nuts and seeds ?Quinoa, 1/2 cup = 5 grams ?Bulgur, 1/2 cup = 4.1 grams ?Popcorn, 3 cups = 3.6 grams ?Whole Wheat Spaghetti, 1/2 cup = 3.2 grams ?Barley, 1/2 cup = 3 grams ?Oatmeal, 1/2 cup = 2 grams ?Whole Wheat English Muffin = 3 grams ?Corn, 1/2 cup = 2.1 grams ?Brown Rice, 1/2 cup = 1.8 grams ?Flax seeds, 1 Tablespoon = 2.8 grams ?Chia seeds, 1 Tablespoon = 11 grams ?Almonds, 1 ounce = 3.5 grams fiber ?Fiber from legumes ?Kidney beans, 1/2 cup 7.9 grams ?Lentils, 1/2 cup = 7.8 grams ?Pinto beans, 1/2 cup = 7.7 grams ?Black beans, 1/2 cup = 7.6 grams ?Lima beans, 1/2 cup 6.4 grams ?Chick peas, 1/2 cup = 5.3 grams ?Black eyed peas, 1/2 cup = 4 grams ?Fiber from fruits and vegetables ?Pear, 6 grams ?Apple. 3.3 grams ?Raspberries or Blackberries, 3/4 cup = 6 grams ?Strawberries or Blueberries, 1 cup = 3.4 grams ?Baked sweet potato 3.8 grams fiber ?Baked potato with skin 4.4 grams  ?Peas, 1/2 cup = 4.4 grams  ?Spinach, 1/2 cup cooked = 3.5 grams  ?Avocado, 1/2 = 5 grams ? ? ? ? ? ? ?

## 2021-08-31 NOTE — Progress Notes (Signed)
Diabetes Self-Management Education  Visit Type: First/Initial  Appt. Start Time: 1535 Appt. End Time: 5397  08/31/2021  Ms. Becky Moore, identified by name and date of birth, is a 51 y.o. female with a diagnosis of Diabetes: Type 2.   ASSESSMENT Patient is here today alone.  She was referred for newly diagnosed diabetes. Patient has little time, does not enjoy cooking, and is interested in more plant based foods.  History includes Type 2 Diabetes, obesity, vitamin D deficiency, HLD Medications include:  Metformin (did not start or pick up), Ozempic (picked up and to start- educated on this today). Labs noted to include A1C 6.5%, cholesterol 165, triglycerides 84, HDL 58, LDL 90, Vitamin D 61, est GFR 93 06/08/2021  Weight hx: 212 lbs 08/31/2020 215 lbs 08/24/2021  Highest adult weight 140 lbs lowest adult weight  Patient lives alone often and her grandson stays with her during the week.  She does the shopping and simple cooking.  Eats a lot of salads. She just got a dog and is walking more frequently.  Walks daily 1 mile. Her mother passed away from complications of diabetes in 89. She works for [employer].  She was working day shift 12 hours per day 7 days per week with high stress and recently hours were decreased to 9.5 hours per day. Currently wearing braces.  Height 5\' 5"  (1.651 m), weight 212 lb (96.2 kg). Body mass index is 35.28 kg/m.   Diabetes Self-Management Education - 08/31/21 1612       Visit Information   Visit Type First/Initial      Initial Visit   Diabetes Type Type 2    Are you currently following a meal plan? No    Are you taking your medications as prescribed? No    Date Diagnosed 06/2021      Health Coping   How would you rate your overall health? Excellent      Psychosocial Assessment   Patient Belief/Attitude about Diabetes Motivated to manage diabetes    Self-care barriers None    Self-management support Doctor's office    Other persons  present Patient    Patient Concerns Nutrition/Meal planning;Glycemic Control;Healthy Lifestyle;Weight Control;Medication    Special Needs None    Preferred Learning Style No preference indicated    Learning Readiness Ready    How often do you need to have someone help you when you read instructions, pamphlets, or other written materials from your doctor or pharmacy? 1 - Never    What is the last grade level you completed in school? 12      Pre-Education Assessment   Patient understands the diabetes disease and treatment process. Needs Instruction    Patient understands incorporating nutritional management into lifestyle. Needs Instruction    Patient undertands incorporating physical activity into lifestyle. Needs Instruction    Patient understands using medications safely. Needs Instruction    Patient understands monitoring blood glucose, interpreting and using results Needs Instruction    Patient understands prevention, detection, and treatment of acute complications. Needs Instruction    Patient understands prevention, detection, and treatment of chronic complications. Needs Instruction    Patient understands how to develop strategies to address psychosocial issues. Needs Instruction    Patient understands how to develop strategies to promote health/change behavior. Needs Instruction      Complications   Last HgB A1C per patient/outside source 6.5 %   06/08/2021   How often do you check your blood sugar? Not recommended by provider  Have you had a dilated eye exam in the past 12 months? Yes    Have you had a dental exam in the past 12 months? Yes    Are you checking your feet? Yes    How many days per week are you checking your feet? 7      Dietary Intake   Breakfast coffee with 2 creamers, water, slimfast, canteloupe    Snack (morning) none    Lunch salad with baked chicken, small amount of ranch    Snack (afternoon) occasional thin oreo cookies (5)    Dinner water, slim fast OR  out to eat [employer] mediteranean    Snack (evening) peanuts, regular popsickles    Beverage(s) water, coffee with cream, slimfast, unsweetened tea      Exercise   Exercise Type Light (walking / raking leaves)    How many days per week to you exercise? 7    How many minutes per day do you exercise? 30    Total minutes per week of exercise 210      Patient Education   Previous Diabetes Education No    Disease state  Definition of diabetes, type 1 and 2, and the diagnosis of diabetes;Factors that contribute to the development of diabetes;Explored patient's options for treatment of their diabetes    Nutrition management  Role of diet in the treatment of diabetes and the relationship between the three main macronutrients and blood glucose level;Food label reading, portion sizes and measuring food.;Carbohydrate counting;Meal options for control of blood glucose level and chronic complications.    Physical activity and exercise  Role of exercise on diabetes management, blood pressure control and cardiac health.    Medications Reviewed patients medication for diabetes, action, purpose, timing of dose and side effects.;Other (comment)   ozempic instruction   Monitoring Yearly dilated eye exam;Daily foot exams;Identified appropriate SMBG and/or A1C goals.;Purpose and frequency of SMBG.    Acute complications Taught treatment of hypoglycemia - the 15 rule.    Chronic complications Relationship between chronic complications and blood glucose control;Identified and discussed with patient  current chronic complications    Psychosocial adjustment Worked with patient to identify barriers to care and solutions;Role of stress on diabetes;Identified and addressed patients feelings and concerns about diabetes    Personal strategies to promote health Lifestyle issues that need to be addressed for better diabetes care      Individualized Goals (developed by patient)   Nutrition General guidelines for healthy choices  and portions discussed    Physical Activity Exercise 5-7 days per week;30 minutes per day    Medications take my medication as prescribed    Monitoring  test my blood glucose as discussed    Reducing Risk examine blood glucose patterns;increase portions of healthy fats;do foot checks daily      Post-Education Assessment   Patient understands the diabetes disease and treatment process. Demonstrates understanding / competency    Patient understands incorporating nutritional management into lifestyle. Needs Review    Patient undertands incorporating physical activity into lifestyle. Demonstrates understanding / competency    Patient understands using medications safely. Demonstrates understanding / competency    Patient understands monitoring blood glucose, interpreting and using results Demonstrates understanding / competency    Patient understands prevention, detection, and treatment of acute complications. Demonstrates understanding / competency    Patient understands prevention, detection, and treatment of chronic complications. Demonstrates understanding / competency    Patient understands how to develop strategies to address psychosocial issues. Demonstrates understanding /  competency    Patient understands how to develop strategies to promote health/change behavior. Needs Review      Outcomes   Expected Outcomes Demonstrated interest in learning. Expect positive outcomes    Future DMSE 2 months    Program Status Not Completed             Individualized Plan for Diabetes Self-Management Training:   Learning Objective:  Patient will have a greater understanding of diabetes self-management. Patient education plan is to attend individual and/or group sessions per assessed needs and concerns.   Plan:   Patient Instructions  Recipe Resources:  Neldon Mc- you tube  Well your world- you tube  Mindfulness:    Choices  Eat slowly Stop when satisfied  Eat more Non-Starchy  Vegetables These include greens, broccoli, cauliflower, cabbage, carrots, beets, eggplant, peppers, squash and others. Minimize added sugars and refined grains Rethink what you drink.  Choose beverages without added sugar.  Look for 0 carbs on the label. See the list of whole grains below.  Find alternatives to usual sweet treats. Choose whole foods over processed. Make simple meals at home more often than eating out.  Tips to increase fiber in your diet: (All plants have fiber.  Eat a variety. There are more than are on this list.) Slowly increase the amount of fiber you eat to 25-35 grams per day.  (More is fine if you tolerate it.) Fiber from whole grains, nuts and seeds Quinoa, 1/2 cup = 5 grams Bulgur, 1/2 cup = 4.1 grams Popcorn, 3 cups = 3.6 grams Whole Wheat Spaghetti, 1/2 cup = 3.2 grams Barley, 1/2 cup = 3 grams Oatmeal, 1/2 cup = 2 grams Whole Wheat English Muffin = 3 grams Corn, 1/2 cup = 2.1 grams Brown Rice, 1/2 cup = 1.8 grams Flax seeds, 1 Tablespoon = 2.8 grams Chia seeds, 1 Tablespoon = 11 grams Almonds, 1 ounce = 3.5 grams fiber Fiber from legumes Kidney beans, 1/2 cup 7.9 grams Lentils, 1/2 cup = 7.8 grams Pinto beans, 1/2 cup = 7.7 grams Black beans, 1/2 cup = 7.6 grams Lima beans, 1/2 cup 6.4 grams Chick peas, 1/2 cup = 5.3 grams Black eyed peas, 1/2 cup = 4 grams Fiber from fruits and vegetables Pear, 6 grams Apple. 3.3 grams Raspberries or Blackberries, 3/4 cup = 6 grams Strawberries or Blueberries, 1 cup = 3.4 grams Baked sweet potato 3.8 grams fiber Baked potato with skin 4.4 grams  Peas, 1/2 cup = 4.4 grams  Spinach, 1/2 cup cooked = 3.5 grams  Avocado, 1/2 = 5 grams  Expected Outcomes:  Demonstrated interest in learning. Expect positive outcomes  Education material provided: ADA - How to Thrive: A Guide for Your Journey with Diabetes, Food label handouts, Meal plan card, Snack sheet, and Diabetes Resources; Diet and Diabetes recipes for success  from [facility]  If problems or questions, patient to contact team via:  Phone  Future DSME appointment: 2 months

## 2021-11-03 ENCOUNTER — Ambulatory Visit: Payer: BC Managed Care – PPO | Admitting: Dietician

## 2021-11-24 ENCOUNTER — Encounter: Payer: BC Managed Care – PPO | Attending: Family Medicine | Admitting: Dietician

## 2021-11-24 ENCOUNTER — Encounter: Payer: Self-pay | Admitting: Dietician

## 2021-11-24 DIAGNOSIS — E119 Type 2 diabetes mellitus without complications: Secondary | ICD-10-CM | POA: Insufficient documentation

## 2021-11-24 NOTE — Progress Notes (Signed)
Diabetes Self-Management Education  Visit Type: Follow-up  Appt. Start Time: 1510 Appt. End Time: 3329  11/24/2021  Ms. Becky Moore, identified by name and date of birth, is a 51 y.o. female with a diagnosis of Diabetes: Type 2.   ASSESSMENT Patient is here today alone.  She was last seen by myself on 08/31/2021. This is a follow up for newly diagnosed type 2 diabetes. She states that she has increased her exercise and has started to do a H.I.T program 1-2 times per day.  Continues to walk the dog. She has lost 11 lbs in the past 3 months. She has been taking the Ozempic 0.25 for the past 3 months and does not think that she will continue this as she states that she can carry on with the habits she has learned. She rarely eats bread.  She stopped sugar including soda.  Drinking more water. She is reading labels.  History includes Type 2 Diabetes, obesity, vitamin D deficiency, HLD Medications include:  Metformin (did not start or pick up), Ozempic (picked up and to start- educated on this today). Labs noted to include A1C 6.5%, cholesterol 165, triglycerides 84, HDL 58, LDL 90, Vitamin D 61, est GFR 93 06/08/2021   Weight hx: 64" 201 lbs 11/23/2021 212 lbs 08/31/2020 215 lbs 08/24/2021  Highest adult weight 140 lbs lowest adult weight Goal:  190-195 lbs per patient   Patient lives alone often and her grandson stays with her during the week.  She does the shopping and simple cooking.  Eats a lot of salads. She just got a dog and is walking more frequently.  Walks daily 1 mile. Her mother passed away from complications of diabetes in 35. She works for [employer].  She was working day shift 12 hours per day 7 days per week with high stress and recently hours were decreased to 9.5 hours per day.  Weight 201 lb (91.2 kg). Body mass index is 33.45 kg/m.   Diabetes Self-Management Education - 11/24/21 1544       Visit Information   Visit Type Follow-up      Initial Visit    Diabetes Type Type 2    Are you currently following a meal plan? Yes    Are you taking your medications as prescribed? Yes      Psychosocial Assessment   Patient Belief/Attitude about Diabetes Motivated to manage diabetes    Other persons present Patient    Special Needs None    Learning Readiness Ready      Pre-Education Assessment   Patient understands the diabetes disease and treatment process. Comprehends key points    Patient understands incorporating nutritional management into lifestyle. Comprehends key points    Patient undertands incorporating physical activity into lifestyle. Demonstrates understanding / competency    Patient understands using medications safely. Demonstrates understanding / competency    Patient understands monitoring blood glucose, interpreting and using results Needs Review    Patient understands prevention, detection, and treatment of acute complications. Needs Review    Patient understands prevention, detection, and treatment of chronic complications. Compreheands key points    Patient understands how to develop strategies to address psychosocial issues. Demonstrates understanding / competency    Patient understands how to develop strategies to promote health/change behavior. Comprehends key points      Complications   How often do you check your blood sugar? 0 times/day (not testing)      Dietary Intake   Breakfast canteloupe OR [employer] bagel  Snack (morning) none    Lunch chicken salad, pepper jack cheese, crackers, 2 slices pepperoni, fresh fruit    Snack (afternoon) occasional    Dinner skips at times or only fresh fruit OR [employer] chicken sandwich    Snack (evening) occasional    Beverage(s) water, coffee with cream, unsweetened tea, slimfast      Activity / Exercise   Activity / Exercise Type Light (walking / raking leaves);Moderate (swimming / aerobic walking)    How many days per week do you exercise? 7    How many minutes per day do you  exercise? 30    Total minutes per week of exercise 210      Patient Education   Previous Diabetes Education Yes (please comment)   08/2021   Healthy Eating Other (comment)   balance of macronutrients and importans of avoiding skipping meals   Being Active Other (comment)   encouraged continued exercise   Medications Reviewed patients medication for diabetes, action, purpose, timing of dose and side effects.    Acute complications Other (comment)   glucose (finger stick) was 66 in office today.  She had not eaten except for a [employer] bagel this am.  No symptoms and will eat supper after leaving, refused soda, juice and snack.  presentation is normal.     Individualized Goals (developed by patient)   Nutrition General guidelines for healthy choices and portions discussed    Physical Activity Exercise 5-7 days per week;30 minutes per day    Medications Not Applicable    Problem Solving Eating Pattern      Patient Self-Evaluation of Goals - Patient rates self as meeting previously set goals (% of time)   Nutrition >75% (most of the time)    Physical Activity >75% (most of the time)    Medications >75% (most of the time)    Monitoring 25 - 50% (sometimes)    Problem Solving and behavior change strategies  >75% (most of the time)    Reducing Risk (treating acute and chronic complications) >16% (most of the time)    Health Coping >75% (most of the time)      Post-Education Assessment   Patient understands the diabetes disease and treatment process. Comprehends key points    Patient understands incorporating nutritional management into lifestyle. Comprehends key points    Patient undertands incorporating physical activity into lifestyle. Demonstrates understanding / competency    Patient understands using medications safely. Demonstrates understanding / competency    Patient understands monitoring blood glucose, interpreting and using results Comprehends key points    Patient understands prevention,  detection, and treatment of acute complications. Comprehends key points    Patient understands prevention, detection, and treatment of chronic complications. Demonstrates understanding / competency    Patient understands how to develop strategies to address psychosocial issues. Demonstrates understanding / competency    Patient understands how to develop strategies to promote health/change behavior. Demonstrates understanding / competency      Outcomes   Expected Outcomes Demonstrated interest in learning. Expect positive outcomes    Future DMSE 3-4 months    Program Status Not Completed      Subsequent Visit   Since your last visit have you continued or begun to take your medications as prescribed? Yes    Since your last visit have you experienced any weight changes? Loss    Weight Loss (lbs) 11    Since your last visit, are you checking your blood glucose at least once a  day? No             Individualized Plan for Diabetes Self-Management Training:   Learning Objective:  Patient will have a greater understanding of diabetes self-management. Patient education plan is to attend individual and/or group sessions per assessed needs and concerns.   Plan:   Patient Instructions  Consistent meals  Mindfulness:  Consistently scheduled meal - avoid skipping  Choices  Eat slowly  Away from distraction (sitting in kitchen or dining room)  Stop eating when satisfied  Before a snack ask, "Am I hungry or eating for another reason?"   "What can I do instead if I am not hungry?"  Try to find something every day that brings you joy!  Be sure to include a protein with each meal Vegetable with lunch and dinner Consider open faced sandwiches if choosing bread  Work on increasing your fiber (beans, lentils, nuts, seeds, chia seeds, flax seeds- ground and store in freezer)   Expected Outcomes:  Demonstrated interest in learning. Expect positive outcomes  Education material provided:    If problems or questions, patient to contact team via:  Phone  Future DSME appointment: 3-4 months

## 2021-11-24 NOTE — Patient Instructions (Signed)
Consistent meals  Mindfulness:  Consistently scheduled meal - avoid skipping  Choices  Eat slowly  Away from distraction (sitting in kitchen or dining room)  Stop eating when satisfied  Before a snack ask, "Am I hungry or eating for another reason?"   "What can I do instead if I am not hungry?"  Try to find something every day that brings you joy!  Be sure to include a protein with each meal Vegetable with lunch and dinner Consider open faced sandwiches if choosing bread  Work on increasing your fiber (beans, lentils, nuts, seeds, chia seeds, flax seeds- ground and store in freezer)

## 2021-12-07 ENCOUNTER — Telehealth: Payer: Self-pay | Admitting: Cardiology

## 2021-12-07 NOTE — Telephone Encounter (Signed)
*  STAT* If patient is at the pharmacy, call can be transferred to refill team.   1. Which medications need to be refilled? (please list name of each medication and dose if known) atorvastatin (LIPITOR) 40 MG tablet  2. Which pharmacy/location (including street and city if local pharmacy) is medication to be sent to?  [id number]- [place], [place] - [id number]    3. Do they need a 30 day or 90 day supply? [address]

## 2021-12-08 MED ORDER — ATORVASTATIN CALCIUM 40 MG PO TABS: 40.0000 mg | ORAL_TABLET | Freq: Every day | ORAL | 0 refills | Status: DC

## 2021-12-18 NOTE — Progress Notes (Unsigned)
Cardiology Office Note:    Date:  12/19/2021   ID:  Becky Moore, DOB 1970-12-30, MRN [id number]  PCP:  Hayden Rasmussen, MD  Cardiologist:  None  Electrophysiologist:  None   Referring MD: Hayden Rasmussen, MD   Chief Complaint  Patient presents with   Coronary Artery Disease    History of Present Illness:    Becky Moore is a 51 y.o. female with a hx of hyperlipdemia, prediabetes who presents for follow-up.  She was referred by Dr. Volanda Napoleon for an evaluation of cardiovascular risk assessment, initially seen on 07/15/2019.  She had recently been started on lovastatin but did not want to start taking medication.  Her ASCVD risk score was low, so calcium score was recommended for further risk stratification to see if statin was indicated.  Calcium score on 07/22/2019 with 88 (97th percentile).   TTE in 06/16/15 shows Normal LV systolic function; grade 1 diastolic dysfunction; trace MR and TR; atrial septal aneurysm.  Calcium score on 07/22/2019 with 88 (97th percentile).  Coronary CTA on 10/26/2019 showed nonobstructive CAD with calcified plaque in the proximal LAD causing mild (25 to 49%) stenosis.  Since last clinic visit, she reports that she has been doing well.  Denies any chest pain, dyspnea, lower extremity edema, or palpitations.  Has lost 15 pounds.  Has been doing high intensity exercises.  Recently started on Ozempic.  She does report some lightheadedness, denies any syncope.   Past Medical History:  Diagnosis Date   Diabetes mellitus without complication (HCC)    GERD (gastroesophageal reflux disease)    Nissen corrected    Hyperlipidemia    Vertigo     Past Surgical History:  Procedure Laterality Date   CHOLECYSTECTOMY     COLONOSCOPY     HERNIA REPAIR     POLYPECTOMY     WISDOM TOOTH EXTRACTION      Current Medications: Current Meds  Medication Sig   atorvastatin (LIPITOR) 40 MG tablet Take 1 tablet (40 mg total) by mouth daily. **Patient need appointment  for future refill**   finasteride (PROSCAR) 5 MG tablet Take 5 mg by mouth daily.   fluticasone (FLONASE) 50 MCG/ACT nasal spray Place 2 sprays into both nostrils daily.   promethazine (PHENERGAN) 25 MG tablet Take 1 tablet (25 mg total) by mouth every 6 (six) hours as needed for nausea or vomiting.   Semaglutide,0.25 or 0.'5MG'$ /DOS, (OZEMPIC, 0.25 OR 0.5 MG/DOSE,) 2 MG/1.5ML SOPN Inject 0.25 mg into the skin.   Vitamin D, Ergocalciferol, (DRISDOL) 1.25 MG (50000 UNIT) CAPS capsule Take 50,000 Units by mouth once a week.     Allergies:   Patient has no known allergies.   Social History   Socioeconomic History   Marital status: Single    Spouse name: Not on file   Number of children: Not on file   Years of education: Not on file   Highest education level: Not on file  Occupational History   Not on file  Tobacco Use   Smoking status: Former    Packs/day: 0.25    Types: Cigarettes    Quit date: 03/22/2015    Years since quitting: 6.7   Smokeless tobacco: Never   Tobacco comments:    on Chantix x 1 month  Substance and Sexual Activity   Alcohol use: Yes    Alcohol/week: 0.0 standard drinks of alcohol    Comment: once a month   Drug use: No   Sexual activity: Yes  Birth control/protection: None  Other Topics Concern   Not on file  Social History Narrative   Some college    Social Determinants of Health   Financial Resource Strain: Not on file  Food Insecurity: Not on file  Transportation Needs: Not on file  Physical Activity: Not on file  Stress: Not on file  Social Connections: Not on file     Family History: The patient's *family history includes Colon polyps in her mother and sister. There is no history of Colon cancer, Esophageal cancer, Rectal cancer, or Stomach cancer.  ROS:   Please see the history of present illness.     All other systems reviewed and are negative.  EKGs/Labs/Other Studies Reviewed:    The following studies were reviewed today:   EKG:    12/19/2021: Normal sinus rhythm, rate 64, no ST abnormality  Recent Labs: No results found for requested labs within last 365 days.  Recent Lipid Panel    Component Value Date/Time   CHOL 127 12/23/2019 1522   TRIG 88 12/23/2019 1522   HDL 50 12/23/2019 1522   CHOLHDL 2.5 12/23/2019 1522   CHOLHDL 4.2 11/14/2017 0921   VLDL 22.8 10/01/2016 1528   LDLCALC 60 12/23/2019 1522   LDLCALC 151 (H) 11/14/2017 0921    Physical Exam:    VS:  BP 112/76 (BP Location: Left Arm, Patient Position: Sitting, Cuff Size: Large)   Pulse 64   Ht '5\' 4"'$  (1.626 m)   Wt 201 lb 9.6 oz (91.4 kg)   SpO2 96%   BMI 34.60 kg/m     Wt Readings from Last 3 Encounters:  12/19/21 201 lb 9.6 oz (91.4 kg)  11/24/21 201 lb (91.2 kg)  08/31/21 212 lb (96.2 kg)     GEN:  Well nourished, well developed in no acute distress HEENT: Normal NECK: No JVD; No carotid bruits CARDIAC: RRR, no murmurs, rubs, gallops RESPIRATORY:  Clear to auscultation without rales, wheezing or rhonchi  ABDOMEN: Soft, non-tender, non-distended MUSCULOSKELETAL:  No edema; No deformity  SKIN: Warm and dry NEUROLOGIC:  Alert and oriented x 3 PSYCHIATRIC:  Normal affect   ASSESSMENT:    1. Coronary artery disease involving native coronary artery of native heart without angina pectoris   2. Hyperlipidemia, unspecified hyperlipidemia type   3. Lightheadedness   4. Stenosis of carotid artery, unspecified laterality     PLAN:    CAD:  Calcium score on 07/22/2019 was 88 (97th percentile).  Coronary CTA on 10/26/2019 showed nonobstructive CAD with calcified plaque in the proximal LAD causing mild (25 to 49%) stenosis. -Continue atorvastatin 40 mg daily   Hyperlipidemia: LDL 68 on 12/23/2019.  Continue atorvastatin 40 mg daily.  Check lipid panel  Lightheadedness: Suspect related to vertigo.  Carotid duplex on 01/08/2020 shows mild bilateral stenosis in carotid arteries  Prediabetes: A1c 5.7.    Tobacco use: Quit smoking in July 2022.   Congratulated patient on quitting smoking and encouraged continued cessation  RTC in 1 year   Medication Adjustments/Labs and Tests Ordered: Current medicines are reviewed at length with the patient today.  Concerns regarding medicines are outlined above.  Orders Placed This Encounter  Procedures   Basic metabolic panel   Lipid panel   EKG 12-Lead   No orders of the defined types were placed in this encounter.   Patient Instructions  Medication Instructions:  Your physician recommends that you continue on your current medications as directed. Please refer to the Current Medication list given to you  today.  *If you need a refill on your cardiac medications before your next appointment, please call your pharmacy*   Lab Work: Please return for FASTING labs  (BMET, Lipid)  Our in office lab hours are Monday-Friday 8:00-4:00, closed for lunch 12:45-1:45 pm.  No appointment needed.  [facility] locations:   [place] ([place]) - [address] [place] [address] - [address] [place] - [address] ([facility])  Follow-Up: At [facility], you and your health needs are our priority.  As part of our continuing mission to provide you with exceptional heart care, we have created designated Provider Care Teams.  These Care Teams include your primary Cardiologist (physician) and Advanced Practice Providers (APPs -  Physician Assistants and Nurse Practitioners) who all work together to provide you with the care you need, when you need it.  We recommend signing up for the patient portal called "MyChart".  Sign up information is provided on this After Visit Summary.  MyChart is used to connect with  patients for Virtual Visits (Telemedicine).  Patients are able to view lab/test results, encounter notes, upcoming appointments, etc.  Non-urgent messages can be sent to your provider as well.   To learn more about what you can do with MyChart, go to [email or web address].    Your next appointment:   12 month(s)  The format for your next appointment:   In Person  Provider:   Dr. Gardiner Rhyme  Important Information About Sugar         Signed, Donato Heinz, MD  12/19/2021 5:55 PM    [place]

## 2021-12-19 ENCOUNTER — Encounter: Payer: Self-pay | Admitting: Cardiology

## 2021-12-19 ENCOUNTER — Ambulatory Visit (INDEPENDENT_AMBULATORY_CARE_PROVIDER_SITE_OTHER): Payer: BC Managed Care – PPO | Admitting: Cardiology

## 2021-12-19 VITALS — BP 112/76 | HR 64 | Ht 64.0 in | Wt 201.6 lb

## 2021-12-19 DIAGNOSIS — I2510 Atherosclerotic heart disease of native coronary artery without angina pectoris: Secondary | ICD-10-CM

## 2021-12-19 DIAGNOSIS — I6529 Occlusion and stenosis of unspecified carotid artery: Secondary | ICD-10-CM

## 2021-12-19 DIAGNOSIS — R42 Dizziness and giddiness: Secondary | ICD-10-CM | POA: Diagnosis not present

## 2021-12-19 DIAGNOSIS — E785 Hyperlipidemia, unspecified: Secondary | ICD-10-CM | POA: Diagnosis not present

## 2021-12-19 NOTE — Patient Instructions (Signed)
Medication Instructions:  Your physician recommends that you continue on your current medications as directed. Please refer to the Current Medication list given to you today.  *If you need a refill on your cardiac medications before your next appointment, please call your pharmacy*   Lab Work: Please return for FASTING labs  (BMET, Lipid)  Our in office lab hours are Monday-Friday 8:00-4:00, closed for lunch 12:45-1:45 pm.  No appointment needed.  [facility] locations:   [place] ([place]) - [address] [place] [address] - [address] [place] - [address] ([facility])  Follow-Up: At [facility], you and your health needs are our priority.  As part of our continuing mission to provide you with exceptional heart care, we have created designated Provider Care Teams.  These Care Teams include your primary Cardiologist (physician) and Advanced Practice Providers (APPs -  Physician Assistants and Nurse Practitioners) who all work together to provide you with the care you need, when you need it.  We recommend signing up for the patient portal called "MyChart".  Sign up information is provided on this After Visit Summary.  MyChart is used to connect with patients for Virtual Visits (Telemedicine).  Patients are able to view lab/test results, encounter notes, upcoming appointments, etc.  Non-urgent messages can be sent to your provider as well.   To learn more about what you can do with MyChart, go to [email or web address].    Your next appointment:   12 month(s)  The format for your next appointment:   In Person  Provider:   Dr. Gardiner Rhyme  Important Information About Sugar

## 2022-01-01 LAB — LIPID PANEL
Chol/HDL Ratio: 2.9 ratio (ref 0.0–4.4)
Cholesterol, Total: 149 mg/dL (ref 100–199)
HDL: 51 mg/dL (ref >39)
LDL Chol Calc (NIH): 80 mg/dL (ref 0–99)
Triglycerides: 95 mg/dL (ref 0–149)
VLDL Cholesterol Cal: 18 mg/dL (ref 5–40)

## 2022-01-01 LAB — BASIC METABOLIC PANEL
BUN/Creatinine Ratio: 7 — ABNORMAL LOW (ref 9–23)
BUN: 7 mg/dL (ref 6–24)
CO2: 24 mmol/L (ref 20–29)
Calcium: 9.4 mg/dL (ref 8.7–10.2)
Chloride: 104 mmol/L (ref 96–106)
Creatinine, Ser: 0.96 mg/dL (ref 0.57–1.00)
Glucose: 89 mg/dL (ref 70–99)
Potassium: 3.9 mmol/L (ref 3.5–5.2)
Sodium: 140 mmol/L (ref 134–144)
eGFR: 71 mL/min/{1.73_m2} (ref >59)

## 2022-01-09 ENCOUNTER — Other Ambulatory Visit: Payer: Self-pay | Admitting: *Deleted

## 2022-01-09 MED ORDER — ATORVASTATIN CALCIUM 80 MG PO TABS: 80.0000 mg | ORAL_TABLET | Freq: Every day | ORAL | 3 refills | Status: DC

## 2022-01-29 ENCOUNTER — Ambulatory Visit: Payer: BC Managed Care – PPO | Admitting: Dietician

## 2022-02-23 ENCOUNTER — Ambulatory Visit: Payer: BC Managed Care – PPO | Admitting: Dietician

## 2022-03-14 ENCOUNTER — Other Ambulatory Visit: Payer: Self-pay | Admitting: Cardiology

## 2022-06-30 ENCOUNTER — Encounter (HOSPITAL_COMMUNITY): Payer: Self-pay | Admitting: Emergency Medicine

## 2022-06-30 ENCOUNTER — Emergency Department (HOSPITAL_COMMUNITY)
Admission: EM | Admit: 2022-06-30 | Discharge: 2022-07-01 | Disposition: A | Discharge status: Home / Self Care | Payer: BC Managed Care – PPO | Attending: Emergency Medicine | Admitting: Emergency Medicine

## 2022-06-30 ENCOUNTER — Emergency Department (HOSPITAL_BASED_OUTPATIENT_CLINIC_OR_DEPARTMENT_OTHER)
Admission: EM | Admit: 2022-06-30 | Discharge: 2022-06-30 | Discharge status: Against Medical Advice | Payer: BC Managed Care – PPO

## 2022-06-30 DIAGNOSIS — Z20822 Contact with and (suspected) exposure to covid-19: Secondary | ICD-10-CM | POA: Insufficient documentation

## 2022-06-30 DIAGNOSIS — E876 Hypokalemia: Secondary | ICD-10-CM | POA: Diagnosis not present

## 2022-06-30 DIAGNOSIS — R112 Nausea with vomiting, unspecified: Secondary | ICD-10-CM

## 2022-06-30 DIAGNOSIS — R197 Diarrhea, unspecified: Secondary | ICD-10-CM | POA: Insufficient documentation

## 2022-06-30 LAB — CBC WITH DIFFERENTIAL/PLATELET
Abs Immature Granulocytes: 0.03 10*3/uL (ref 0.00–0.07)
Basophils Absolute: 0 10*3/uL (ref 0.0–0.1)
Basophils Relative: 0 %
Eosinophils Absolute: 0.1 10*3/uL (ref 0.0–0.5)
Eosinophils Relative: 1 %
HCT: 46.5 % — ABNORMAL HIGH (ref 36.0–46.0)
Hemoglobin: 15 g/dL (ref 12.0–15.0)
Immature Granulocytes: 0 %
Lymphocytes Relative: 26 %
Lymphs Abs: 2.7 10*3/uL (ref 0.7–4.0)
MCH: 29.3 pg (ref 26.0–34.0)
MCHC: 32.3 g/dL (ref 30.0–36.0)
MCV: 90.8 fL (ref 80.0–100.0)
Monocytes Absolute: 0.9 10*3/uL (ref 0.1–1.0)
Monocytes Relative: 9 %
Neutro Abs: 6.5 10*3/uL (ref 1.7–7.7)
Neutrophils Relative %: 64 %
Platelets: 258 10*3/uL (ref 150–400)
RBC: 5.12 MIL/uL — ABNORMAL HIGH (ref 3.87–5.11)
RDW: 13.6 % (ref 11.5–15.5)
WBC: 10.2 10*3/uL (ref 4.0–10.5)
nRBC: 0 % (ref 0.0–0.2)

## 2022-06-30 LAB — RESP PANEL BY RT-PCR (RSV, FLU A&B, COVID)  RVPGX2
Influenza A by PCR: NEGATIVE
Influenza B by PCR: NEGATIVE
Resp Syncytial Virus by PCR: NEGATIVE
SARS Coronavirus 2 by RT PCR: NEGATIVE

## 2022-06-30 LAB — URINALYSIS, ROUTINE W REFLEX MICROSCOPIC
Bacteria, UA: NONE SEEN
Bilirubin Urine: NEGATIVE
Glucose, UA: NEGATIVE mg/dL
Ketones, ur: NEGATIVE mg/dL
Leukocytes,Ua: NEGATIVE
Nitrite: NEGATIVE
Protein, ur: NEGATIVE mg/dL
Specific Gravity, Urine: 1.012 (ref 1.005–1.030)
pH: 5 (ref 5.0–8.0)

## 2022-06-30 LAB — COMPREHENSIVE METABOLIC PANEL
ALT: 46 U/L — ABNORMAL HIGH (ref 0–44)
AST: 32 U/L (ref 15–41)
Albumin: 4.3 g/dL (ref 3.5–5.0)
Alkaline Phosphatase: 107 U/L (ref 38–126)
Anion gap: 7 (ref 5–15)
BUN: 18 mg/dL (ref 6–20)
CO2: 25 mmol/L (ref 22–32)
Calcium: 9.4 mg/dL (ref 8.9–10.3)
Chloride: 106 mmol/L (ref 98–111)
Creatinine, Ser: 0.98 mg/dL (ref 0.44–1.00)
GFR, Estimated: >60 mL/min (ref >60)
Glucose, Bld: 111 mg/dL — ABNORMAL HIGH (ref 70–99)
Potassium: 3.2 mmol/L — ABNORMAL LOW (ref 3.5–5.1)
Sodium: 138 mmol/L (ref 135–145)
Total Bilirubin: 0.7 mg/dL (ref 0.3–1.2)
Total Protein: 7.9 g/dL (ref 6.5–8.1)

## 2022-06-30 LAB — LIPASE, BLOOD: Lipase: 38 U/L (ref 11–51)

## 2022-06-30 NOTE — ED Notes (Signed)
Pt says she doesn't want to wait. Left before triage began.

## 2022-06-30 NOTE — ED Provider Triage Note (Cosign Needed)
Emergency Medicine Provider Triage Evaluation Note  Becky Moore , a 51 y.o. female  was evaluated in triage.  Pt complains of 3 days of n/v/d. Grandson had same..  Review of Systems  Positive:  Negative:   Physical Exam  BP (!) 144/98 (BP Location: Left Arm)   Pulse 83   Temp 98.2 F (36.8 C) (Oral)   Resp 18   Ht '5\' 4"'$  (1.626 m)   Wt 93 kg   SpO2 91%   BMI 35.19 kg/m  Gen:   Awake, no distress   Resp:  Normal effort  MSK:   Moves extremities without difficulty  Other:  No tenderness  Medical Decision Making  Medically screening exam initiated at 8:35 PM.  Appropriate orders placed.  Becky Moore was informed that the remainder of the evaluation will be completed by another provider, this initial triage assessment does not replace that evaluation, and the importance of remaining in the ED until their evaluation is complete.     Margarita Mail, PA-C 06/30/22 2036

## 2022-06-30 NOTE — ED Triage Notes (Signed)
Pt arriving POV with complaint of N/V/D x2 days. Pt states she has been trying to keep fluids in her system but is unable to eat anything.

## 2022-07-01 MED ORDER — LOPERAMIDE HCL 2 MG PO CAPS: 2.0000 mg | ORAL_CAPSULE | Freq: Four times a day (QID) | ORAL | 0 refills | Status: DC | PRN

## 2022-07-01 MED ORDER — SODIUM CHLORIDE 0.9 % IV BOLUS
1000.0000 mL | Freq: Once | INTRAVENOUS | Status: AC
  Administered 2022-07-01: 1000 mL via INTRAVENOUS

## 2022-07-01 MED ORDER — ONDANSETRON 4 MG PO TBDP: 4.0000 mg | ORAL_TABLET | Freq: Three times a day (TID) | ORAL | 0 refills | Status: DC | PRN

## 2022-07-01 MED ORDER — POTASSIUM CHLORIDE CRYS ER 20 MEQ PO TBCR
40.0000 meq | EXTENDED_RELEASE_TABLET | Freq: Once | ORAL | Status: AC
  Administered 2022-07-01: 40 meq via ORAL
  Filled 2022-07-01: qty 2

## 2022-07-01 MED ORDER — ONDANSETRON HCL 4 MG/2ML IJ SOLN
4.0000 mg | Freq: Once | INTRAMUSCULAR | Status: AC
  Administered 2022-07-01: 4 mg via INTRAVENOUS
  Filled 2022-07-01: qty 2

## 2022-07-01 NOTE — ED Provider Notes (Signed)
[place] DEPT Provider Note   CSN: [id number] Arrival date & time: 06/30/22  1930     History  Chief Complaint  Patient presents with   Nausea   Emesis   Diarrhea    Becky Moore is a 51 y.o. female.  Patient with history of cholecystectomy, Niesen fundoplication, diabetes presents to the emergency department for 3-day history of nausea, vomiting, diarrhea.  Symptoms started after exposure to grandson who also had vomiting and diarrhea.  Patient denies fever, suspicious food or water exposure, recent travel.  No recent antibiotics.  She has had some abdominal cramping but no focal abdominal pain.  No chest pain or shortness of breath.  No blood noted in stool.  Last episode of vomiting was yesterday.  Last episode of diarrhea was while in the emergency department.  Patient states that she feels generally weak and dehydrated.       Home Medications Prior to Admission medications   Medication Sig Start Date End Date Taking? Authorizing Provider  atorvastatin (LIPITOR) 40 MG tablet Take 1 tablet (40 mg total) by mouth daily. 03/14/22   Donato Heinz, MD  atorvastatin (LIPITOR) 80 MG tablet Take 1 tablet (80 mg total) by mouth daily. 01/09/22   Donato Heinz, MD  bimatoprost (LATISSE) 0.03 % ophthalmic solution SMARTSIG:1 Application In Eye(s) Daily Patient not taking: Reported on 12/19/2021 09/18/19   [provider]  cyclobenzaprine (FLEXERIL) 10 MG tablet Take 10 mg by mouth daily as needed. Patient not taking: Reported on 12/19/2021    [provider]  finasteride (PROSCAR) 5 MG tablet Take 5 mg by mouth daily. 07/10/19   [provider]  fluticasone (FLONASE) 50 MCG/ACT nasal spray Place 2 sprays into both nostrils daily. 10/01/16   Marletta Lor, MD  Meclizine HCl 25 MG CHEW Chew 1 tablet (25 mg total) by mouth 4 (four) times daily as needed. Patient not taking: Reported on 12/19/2021 01/10/18    Billie Ruddy, MD  metFORMIN (GLUCOPHAGE) 500 MG tablet Take by mouth 2 (two) times daily with a meal.    [provider]  ondansetron (ZOFRAN) 8 MG tablet Take 8 mg by mouth daily as needed. Patient not taking: Reported on 12/19/2021 11/01/21   [provider]  promethazine (PHENERGAN) 25 MG tablet Take 1 tablet (25 mg total) by mouth every 6 (six) hours as needed for nausea or vomiting. 10/12/16   Marletta Lor, MD  Semaglutide,0.25 or 0.'5MG'$ /DOS, (OZEMPIC, 0.25 OR 0.5 MG/DOSE,) 2 MG/1.5ML SOPN Inject 0.25 mg into the skin.    [provider]  spironolactone (ALDACTONE) 25 MG tablet TAKE 1 TABLET BY MOUTH EVERY DAY 03/14/22   Donato Heinz, MD  Vitamin D, Ergocalciferol, (DRISDOL) 1.25 MG (50000 UNIT) CAPS capsule Take 50,000 Units by mouth once a week. 03/12/20   [provider]      Allergies    Patient has no known allergies.    Review of Systems   Review of Systems  Physical Exam Updated Vital Signs BP (!) 152/90   Pulse 85   Temp 97.7 F (36.5 C) (Oral)   Resp 18   Ht '5\' 4"'$  (1.626 m)   Wt 93 kg   SpO2 100%   BMI 35.19 kg/m   Physical Exam Vitals and nursing note reviewed.  Constitutional:      General: She is not in acute distress.    Appearance: She is well-developed.  HENT:     Head: Normocephalic  and atraumatic.     Right Ear: External ear normal.     Left Ear: External ear normal.     Nose: Nose normal.  Eyes:     Conjunctiva/sclera: Conjunctivae normal.  Cardiovascular:     Rate and Rhythm: Normal rate and regular rhythm.     Heart sounds: No murmur heard. Pulmonary:     Effort: No respiratory distress.     Breath sounds: No wheezing, rhonchi or rales.  Abdominal:     Palpations: Abdomen is soft.     Tenderness: There is no abdominal tenderness. There is no guarding or rebound.     Comments: Abdomen soft and nontender.  Musculoskeletal:     Cervical back: Normal range of motion and neck supple.     Right  lower leg: No edema.     Left lower leg: No edema.  Skin:    General: Skin is warm and dry.     Findings: No rash.  Neurological:     General: No focal deficit present.     Mental Status: She is alert. Mental status is at baseline.     Motor: No weakness.  Psychiatric:        Mood and Affect: Mood normal.     ED Results / Procedures / Treatments   Labs (all labs ordered are listed, but only abnormal results are displayed) Labs Reviewed  CBC WITH DIFFERENTIAL/PLATELET - Abnormal; Notable for the following components:      Result Value   RBC 5.12 (*)    HCT 46.5 (*)    All other components within normal limits  COMPREHENSIVE METABOLIC PANEL - Abnormal; Notable for the following components:   Potassium 3.2 (*)    Glucose, Bld 111 (*)    ALT 46 (*)    All other components within normal limits  URINALYSIS, ROUTINE W REFLEX MICROSCOPIC - Abnormal; Notable for the following components:   Hgb urine dipstick MODERATE (*)    All other components within normal limits  RESP PANEL BY RT-PCR (RSV, FLU A&B, COVID)  RVPGX2  LIPASE, BLOOD    EKG None  Radiology No results found.  Procedures Procedures    Medications Ordered in ED Medications  sodium chloride 0.9 % bolus 1,000 mL (0 mLs Intravenous Stopped 07/01/22 0138)  ondansetron (ZOFRAN) injection 4 mg (4 mg Intravenous Given 07/01/22 0043)  potassium chloride SA (KLOR-CON M) CR tablet 40 mEq (40 mEq Oral Given 07/01/22 0039)    ED Course/ Medical Decision Making/ A&P    Patient seen and examined. History obtained directly from patient. Work-up including labs, imaging, EKG ordered in triage, if performed, were reviewed.    Labs/EKG: Independently reviewed and interpreted.  This included: CBC with normal white blood cell count and hemoglobin otherwise unremarkable; CMP mild hypokalemia at 3.2 otherwise unremarkable, normal anion gap; normal lipase; negative respiratory panel; no compelling signs of infection on  UA.  Imaging: None ordered  Medications/Fluids: Ordered: Fluid bolus, Zofran, oral potassium repletion.    Most recent vital signs reviewed and are as follows: BP (!) 152/90   Pulse 85   Temp 97.7 F (36.5 C) (Oral)   Resp 18   Ht '5\' 4"'$  (1.626 m)   Wt 93 kg   SpO2 100%   BMI 35.19 kg/m   Initial impression: Nausea, vomiting, diarrhea.  3:12 AM Reassessment performed. Patient appears stable.  She is dressed, sitting on the edge of the bed.  Appears comfortable.  She has had some sips of fluid  without vomiting.  She is comfortable with discharge.  Most current vital signs reviewed and are as follows: BP 124/68   Pulse 61   Temp 97.7 F (36.5 C) (Oral)   Resp 19   Ht '5\' 4"'$  (1.626 m)   Wt 93 kg   SpO2 100%   BMI 35.19 kg/m   Plan: Discharge to home.   Prescriptions written for: Zofran, Imodium  Other home care instructions discussed: Bland diet, good hydration  ED return instructions discussed: The patient was urged to return to the Emergency Department immediately with worsening of current symptoms, worsening abdominal pain, persistent vomiting, blood noted in stools, fever, or any other concerns. The patient verbalized understanding.   Follow-up instructions discussed: Patient encouraged to follow-up with their PCP in 2 days for recheck if not feeling better.                          Medical Decision Making Risk Prescription drug management.   For this patient's complaint of abdominal pain, the following conditions were considered on the differential diagnosis: gastritis/PUD, enteritis/duodenitis, appendicitis, cholelithiasis/cholecystitis, cholangitis, pancreatitis, ruptured viscus, colitis, diverticulitis, small/large bowel obstruction, proctitis, cystitis, pyelonephritis, ureteral colic, aortic dissection, aortic aneurysm. In women, ectopic pregnancy, pelvic inflammatory disease, ovarian cysts, and tubo-ovarian abscess were also considered. Atypical chest etiologies  were also considered including ACS, PE, and pneumonia.   The patient's vital signs, pertinent lab work and imaging were reviewed and interpreted as discussed in the ED course. Hospitalization was considered for further testing, treatments, or serial exams/observation. However as patient is well-appearing, has a stable exam, and reassuring studies today, I do not feel that they warrant admission at this time. This plan was discussed with the patient who verbalizes agreement and comfort with this plan and seems reliable and able to return to the Emergency Department with worsening or changing symptoms.          Final Clinical Impression(s) / ED Diagnoses Final diagnoses:  Nausea vomiting and diarrhea  Hypokalemia    Rx / DC Orders ED Discharge Orders          Ordered    ondansetron (ZOFRAN-ODT) 4 MG disintegrating tablet  Every 8 hours PRN        07/01/22 0311    loperamide (IMODIUM) 2 MG capsule  4 times daily PRN        07/01/22 0311              Carlisle Cater, PA-C 07/01/22 0313    Fatima Blank, MD 07/01/22 [phone]

## 2022-07-01 NOTE — Discharge Instructions (Addendum)
Please read and follow all provided instructions.  Your diagnoses today include:  1. Nausea vomiting and diarrhea   2. Hypokalemia     TTests performed today include: Blood cell counts and platelets: Normal white blood cell count and red blood cell count Kidney and liver function tests: Slightly low potassium Pancreas function test (called lipase): No sign of pancreatitis Urine test to look for infection: No sign of infection Vital signs. See below for your results today.   Medications prescribed:  Zofran (ondansetron) - for nausea and vomiting  Imodium - medication for diarrhea  Take any prescribed medications only as directed.  Home care instructions:  Follow any educational materials contained in this packet.  Drink clear liquids for the next 24 hours and introduce solid foods slowly after 24 hours using the b.r.a.t. diet (Bananas, Rice, Applesauce, Toast, Yogurt).    Follow-up instructions: Please follow-up with your primary care provider in the next 2 days for further evaluation of your symptoms. If you are not feeling better in 48 hours you may have a condition that is more serious and you need re-evaluation.   Return instructions:  SEEK IMMEDIATE MEDICAL ATTENTION IF: If you have pain that does not go away or becomes severe  A temperature above 101F develops  Repeated vomiting occurs (multiple episodes)  If you have pain that becomes localized to portions of the abdomen. The right side could possibly be appendicitis. In an adult, the left lower portion of the abdomen could be colitis or diverticulitis.  Blood is being passed in stools or vomit (bright red or black tarry stools)  You develop chest pain, difficulty breathing, dizziness or fainting, or become confused, poorly responsive, or inconsolable (young children) If you have any other emergent concerns regarding your health  Additional Information: Abdominal (belly) pain can be caused by many things. Your caregiver  performed an examination and possibly ordered blood/urine tests and imaging (CT scan, x-rays, ultrasound). Many cases can be observed and treated at home after initial evaluation in the emergency department. Even though you are being discharged home, abdominal pain can be unpredictable. Therefore, you need a repeated exam if your pain does not resolve, returns, or worsens. Most patients with abdominal pain don't have to be admitted to the hospital or have surgery, but serious problems like appendicitis and gallbladder attacks can start out as nonspecific pain. Many abdominal conditions cannot be diagnosed in one visit, so follow-up evaluations are very important.  Your vital signs today were: BP 124/68   Pulse 61   Temp 97.7 F (36.5 C) (Oral)   Resp 19   Ht '5\' 4"'$  (1.626 m)   Wt 93 kg   SpO2 100%   BMI 35.19 kg/m  If your blood pressure (bp) was elevated above 135/85 this visit, please have this repeated by your doctor within one month. --------------

## 2022-07-01 NOTE — ED Notes (Signed)
Pt able to drink fluids and keep down

## 2022-08-09 ENCOUNTER — Encounter: Payer: Self-pay | Admitting: Family Medicine

## 2022-08-09 ENCOUNTER — Ambulatory Visit: Payer: BC Managed Care – PPO | Admitting: Family Medicine

## 2022-08-09 VITALS — BP 102/60 | HR 75 | Temp 97.8°F | Ht 64.0 in | Wt 205.2 lb

## 2022-08-09 DIAGNOSIS — Z86010 Personal history of colonic polyps: Secondary | ICD-10-CM

## 2022-08-09 DIAGNOSIS — E785 Hyperlipidemia, unspecified: Secondary | ICD-10-CM

## 2022-08-09 DIAGNOSIS — I253 Aneurysm of heart: Secondary | ICD-10-CM | POA: Insufficient documentation

## 2022-08-09 DIAGNOSIS — R7303 Prediabetes: Secondary | ICD-10-CM | POA: Diagnosis not present

## 2022-08-09 DIAGNOSIS — H8110 Benign paroxysmal vertigo, unspecified ear: Secondary | ICD-10-CM

## 2022-08-09 LAB — POCT GLYCOSYLATED HEMOGLOBIN (HGB A1C): Hemoglobin A1C: 6 % — AB (ref 4.0–5.6)

## 2022-08-09 MED ORDER — MECLIZINE HCL 12.5 MG PO TABS: 12.5000 mg | ORAL_TABLET | Freq: Three times a day (TID) | ORAL | 2 refills | Status: AC | PRN

## 2022-08-09 NOTE — Assessment & Plan Note (Signed)
Most likely diagnosis, I gave the patient a handout on the Epley maneuvers that she can start doing at home to help correct the vertigo. I have also written for meclizine 12.5 mg every  8 hours as needed for the vertigo.

## 2022-08-09 NOTE — Assessment & Plan Note (Signed)
Lab Results  Component Value Date   HGBA1C 6.0 (A) 08/09/2022   A1C shows prediabetes. She is not currently on any medication for this, I advised her on low carb food choices and I gave her a handout of foods that are low in carbs. I will see her back in 6 month for repeat A1C.

## 2022-08-09 NOTE — Patient Instructions (Signed)
Berberine 500-600 mg, take the tablet 30 minutes before you eat -- go to [facility] or [employer]

## 2022-08-09 NOTE — Progress Notes (Signed)
New Patient Office Visit  Subjective    Patient ID: Becky Moore, female    DOB: 04-17-1971  Age: 52 y.o. MRN: [id number]  CC:  Chief Complaint  Patient presents with   Establish Care    HPI Becky Moore presents to establish care Was previously seeing Dr. Darron Doom, was last seen last year, hasn't quite been a year. Also saw Dr. Sabra Heck in [place].   Pt is reporting chronic vertigo, first started years ago, not really caused by anything, states it was "out of nowhere". States that it gradually went away, then it started back again recently, mostly with turning over in bed and turning her head too quickly. She denies any other associated symptoms with the vertigo  Outpatient Encounter Medications as of 08/09/2022  Medication Sig   atorvastatin (LIPITOR) 80 MG tablet Take 1 tablet (80 mg total) by mouth daily.   finasteride (PROSCAR) 5 MG tablet Take 5 mg by mouth daily.   fluticasone (FLONASE) 50 MCG/ACT nasal spray Place 2 sprays into both nostrils daily.   meclizine (ANTIVERT) 12.5 MG tablet Take 1 tablet (12.5 mg total) by mouth 3 (three) times daily as needed for dizziness.   [DISCONTINUED] atorvastatin (LIPITOR) 40 MG tablet Take 1 tablet (40 mg total) by mouth daily.   [DISCONTINUED] ondansetron (ZOFRAN-ODT) 4 MG disintegrating tablet Take 1 tablet (4 mg total) by mouth every 8 (eight) hours as needed for nausea or vomiting.   [DISCONTINUED] promethazine (PHENERGAN) 25 MG tablet Take 1 tablet (25 mg total) by mouth every 6 (six) hours as needed for nausea or vomiting.   [DISCONTINUED] Semaglutide,0.25 or 0.'5MG'$ /DOS, (OZEMPIC, 0.25 OR 0.5 MG/DOSE,) 2 MG/1.5ML SOPN Inject 0.25 mg into the skin.   [DISCONTINUED] loperamide (IMODIUM) 2 MG capsule Take 1 capsule (2 mg total) by mouth 4 (four) times daily as needed for diarrhea or loose stools.   [DISCONTINUED] metFORMIN (GLUCOPHAGE) 500 MG tablet Take by mouth 2 (two) times daily with a meal.   [DISCONTINUED] spironolactone  (ALDACTONE) 25 MG tablet TAKE 1 TABLET BY MOUTH EVERY DAY   [DISCONTINUED] Vitamin D, Ergocalciferol, (DRISDOL) 1.25 MG (50000 UNIT) CAPS capsule Take 50,000 Units by mouth once a week.   No facility-administered encounter medications on file as of 08/09/2022.    Past Medical History:  Diagnosis Date   GERD (gastroesophageal reflux disease)    Nissen corrected    Hyperlipidemia    Vertigo     Past Surgical History:  Procedure Laterality Date   CHOLECYSTECTOMY     COLONOSCOPY     HERNIA REPAIR     POLYPECTOMY     WISDOM TOOTH EXTRACTION      Family History  Problem Relation Age of Onset   Diabetes Mother    Colon polyps Mother    Colon polyps Sister    Colon cancer Neg Hx    Esophageal cancer Neg Hx    Rectal cancer Neg Hx    Stomach cancer Neg Hx     Social History   Socioeconomic History   Marital status: Single    Spouse name: Not on file   Number of children: Not on file   Years of education: Not on file   Highest education level: Not on file  Occupational History   Not on file  Tobacco Use   Smoking status: Former    Packs/day: 0.25    Types: Cigarettes    Quit date: 03/22/2015    Years since quitting: 7.3   Smokeless tobacco: Never  Tobacco comments:    on Chantix x 1 month  Vaping Use   Vaping Use: Never used  Substance and Sexual Activity   Alcohol use: Yes    Alcohol/week: 0.0 standard drinks of alcohol    Comment: once a month   Drug use: No   Sexual activity: Not Currently    Birth control/protection: None  Other Topics Concern   Not on file  Social History Narrative   Some college    Social Determinants of Health   Financial Resource Strain: Not on file  Food Insecurity: Not on file  Transportation Needs: Not on file  Physical Activity: Not on file  Stress: Not on file  Social Connections: Not on file  Intimate Partner Violence: Not on file    Review of Systems  All other systems reviewed and are negative.       Objective     BP 102/60 (BP Location: Left Arm, Patient Position: Sitting, Cuff Size: Large)   Pulse 75   Temp 97.8 F (36.6 C) (Oral)   Ht '5\' 4"'$  (1.626 m)   Wt 205 lb 3.2 oz (93.1 kg)   SpO2 98%   BMI 35.22 kg/m   Physical Exam Vitals reviewed.  Constitutional:      Appearance: Normal appearance. She is well-groomed and normal weight.  Eyes:     Conjunctiva/sclera: Conjunctivae normal.  Neck:     Thyroid: No thyromegaly.  Cardiovascular:     Rate and Rhythm: Normal rate and regular rhythm.     Pulses: Normal pulses.     Heart sounds: S1 normal and S2 normal.  Pulmonary:     Effort: Pulmonary effort is normal.     Breath sounds: Normal breath sounds and air entry.  Abdominal:     General: Bowel sounds are normal.  Musculoskeletal:     Right lower leg: No edema.     Left lower leg: No edema.  Neurological:     Mental Status: She is alert and oriented to person, place, and time. Mental status is at baseline.     Gait: Gait is intact.  Psychiatric:        Mood and Affect: Mood and affect normal.        Speech: Speech normal.        Behavior: Behavior normal.        Judgment: Judgment normal.     Last CBC Lab Results  Component Value Date   WBC 10.2 06/30/2022   HGB 15.0 06/30/2022   HCT 46.5 (H) 06/30/2022   MCV 90.8 06/30/2022   MCH 29.3 06/30/2022   RDW 13.6 06/30/2022   PLT 258 38/04/1750   Last metabolic panel Lab Results  Component Value Date   GLUCOSE 111 (H) 06/30/2022   NA 138 06/30/2022   K 3.2 (L) 06/30/2022   CL 106 06/30/2022   CO2 25 06/30/2022   BUN 18 06/30/2022   CREATININE 0.98 06/30/2022   GFRNONAA >60 06/30/2022   CALCIUM 9.4 06/30/2022   PROT 7.9 06/30/2022   ALBUMIN 4.3 06/30/2022   BILITOT 0.7 06/30/2022   ALKPHOS 107 06/30/2022   AST 32 06/30/2022   ALT 46 (H) 06/30/2022   ANIONGAP 7 06/30/2022        Assessment & Plan:   Problem List Items Addressed This Visit       Unprioritized   Benign positional vertigo, unspecified  laterality - Primary (Chronic)    Most likely diagnosis, I gave the patient a handout on the [employer]  maneuvers that she can start doing at home to help correct the vertigo. I have also written for meclizine 12.5 mg every  8 hours as needed for the vertigo.       Relevant Medications   meclizine (ANTIVERT) 12.5 MG tablet   Dyslipidemia    Patient is on atorvastatin 40 mg daily, I reviewed her CT coronary, she will get a new lipid panel in July. For now continue this medication      Relevant Orders   Lipid Panel   Prediabetes    Lab Results  Component Value Date   HGBA1C 6.0 (A) 08/09/2022   A1C shows prediabetes. She is not currently on any medication for this, I advised her on low carb food choices and I gave her a handout of foods that are low in carbs. I will see her back in 6 month for repeat A1C.       Relevant Orders   POC HgB A1c (Completed)   Other Visit Diagnoses     History of colon polyps       Relevant Orders   Ambulatory referral to Gastroenterology       Return in about 6 months (around 02/07/2023) for A1C check, needs to fast for lipid panel.Farrel Conners, MD

## 2022-08-09 NOTE — Assessment & Plan Note (Signed)
Patient is on atorvastatin 40 mg daily, I reviewed her CT coronary, she will get a new lipid panel in July. For now continue this medication

## 2022-08-24 ENCOUNTER — Telehealth: Payer: Self-pay | Admitting: *Deleted

## 2022-08-24 NOTE — Telephone Encounter (Signed)
PCP received FMLA paperwork via fax from [place] [phone]) and wanted to know the reason the patient has requested FMLA.  I called the patient and she stated she requested FMLA due to vertigo.  Patient states symptoms initially started on 12/28-12/29/2023.  She was seen in the ER on 12/30, given IV fluids, no other medications were given and she not been seen another provider until the recent visit with Dr Legrand Como.  States she has bouts of vertigo approximately 2-3 times a week, feels she needs 1 day off to recover and will take Meclizine which makes her sleepy.  Message sent to PCP.

## 2022-08-30 NOTE — Telephone Encounter (Signed)
I spoke with the patient and informed her the paperwork was completed and faxed to [facility] at [phone].  Original was sent to be scanned.

## 2022-10-26 LAB — HM MAMMOGRAPHY

## 2022-11-06 ENCOUNTER — Encounter: Payer: Self-pay | Admitting: Gastroenterology

## 2022-11-14 ENCOUNTER — Telehealth: Payer: Self-pay | Admitting: *Deleted

## 2022-11-14 NOTE — Telephone Encounter (Signed)
Patient was noted on a list given to me by clinical supervisor for lack of recent mammogram results.  Patient stated she had a mammogram last week which was ordered by Dr Cherly Hensen (GYN) and is aware a request was sent via fax for results.Marland Kitchen

## 2022-12-17 ENCOUNTER — Ambulatory Visit (AMBULATORY_SURGERY_CENTER): Payer: BC Managed Care – PPO

## 2022-12-17 ENCOUNTER — Encounter: Payer: Self-pay | Admitting: Gastroenterology

## 2022-12-17 VITALS — Ht 64.0 in | Wt 215.0 lb

## 2022-12-17 DIAGNOSIS — Z86010 Personal history of colonic polyps: Secondary | ICD-10-CM

## 2022-12-17 MED ORDER — NA SULFATE-K SULFATE-MG SULF 17.5-3.13-1.6 GM/177ML PO SOLN: 1.0000 | Freq: Once | ORAL | 0 refills | Status: AC

## 2022-12-17 NOTE — Progress Notes (Signed)

## 2022-12-21 ENCOUNTER — Other Ambulatory Visit: Payer: Self-pay | Admitting: Cardiology

## 2022-12-21 NOTE — Telephone Encounter (Signed)
Pt pharmacy is requesting a refill on atorvastatin 40 mg tablet. There are two strength of this medication on pt's medication list. Which medication would Dr. Bjorn Pippin like to reorder for the pt? Please address

## 2023-01-06 ENCOUNTER — Encounter: Payer: Self-pay | Admitting: Certified Registered Nurse Anesthetist

## 2023-01-07 ENCOUNTER — Other Ambulatory Visit (INDEPENDENT_AMBULATORY_CARE_PROVIDER_SITE_OTHER): Payer: BC Managed Care – PPO

## 2023-01-07 DIAGNOSIS — E785 Hyperlipidemia, unspecified: Secondary | ICD-10-CM

## 2023-01-07 LAB — LIPID PANEL
Cholesterol: 126 mg/dL (ref 0–200)
HDL: 38.9 mg/dL — ABNORMAL LOW (ref >39.00)
LDL Cholesterol: 66 mg/dL (ref 0–99)
NonHDL: 87.35
Total CHOL/HDL Ratio: 3
Triglycerides: 107 mg/dL (ref 0.0–149.0)
VLDL: 21.4 mg/dL (ref 0.0–40.0)

## 2023-01-09 ENCOUNTER — Ambulatory Visit (AMBULATORY_SURGERY_CENTER): Payer: BC Managed Care – PPO | Admitting: Gastroenterology

## 2023-01-09 ENCOUNTER — Encounter: Payer: Self-pay | Admitting: Gastroenterology

## 2023-01-09 VITALS — BP 115/76 | HR 61 | Temp 97.5°F | Resp 12 | Ht 64.0 in | Wt 215.0 lb

## 2023-01-09 DIAGNOSIS — Z86010 Personal history of colonic polyps: Secondary | ICD-10-CM

## 2023-01-09 DIAGNOSIS — Z09 Encounter for follow-up examination after completed treatment for conditions other than malignant neoplasm: Secondary | ICD-10-CM

## 2023-01-09 DIAGNOSIS — D125 Benign neoplasm of sigmoid colon: Secondary | ICD-10-CM | POA: Diagnosis not present

## 2023-01-09 DIAGNOSIS — K635 Polyp of colon: Secondary | ICD-10-CM | POA: Diagnosis not present

## 2023-01-09 MED ORDER — SODIUM CHLORIDE 0.9 % IV SOLN: 500.0000 mL | INTRAVENOUS | Status: DC

## 2023-01-09 NOTE — Op Note (Signed)
[facility] Patient Name: Becky Moore Procedure Date: 01/09/2023 8:59 AM MRN: [id number] Endoscopist: Napoleon Form , MD, [id number] Age: 52 Referring MD:  Date of Birth: [birth year] Gender: Female Account #: [id number] Procedure:                Colonoscopy Indications:              High risk colon cancer surveillance: Personal                            history of colonic polyps, High risk colon cancer                            surveillance: Personal history of adenoma (10 mm or                            greater in size), High risk colon cancer                            surveillance: Personal history of multiple (3 or                            more) adenomas Medicines:                Monitored Anesthesia Care Procedure:                Pre-Anesthesia Assessment:                           - Prior to the procedure, a History and Physical                            was performed, and patient medications and                            allergies were reviewed. The patient's tolerance of                            previous anesthesia was also reviewed. The risks                            and benefits of the procedure and the sedation                            options and risks were discussed with the patient.                            All questions were answered, and informed consent                            was obtained. Prior Anticoagulants: The patient has                            taken no anticoagulant or antiplatelet agents. ASA  Grade Assessment: III - A patient with severe                            systemic disease. After reviewing the risks and                            benefits, the patient was deemed in satisfactory                            condition to undergo the procedure.                           After obtaining informed consent, the colonoscope                            was passed under direct vision. Throughout the                             procedure, the patient's blood pressure, pulse, and                            oxygen saturations were monitored continuously. The                            Olympus Scope SN: [id number] was introduced through                            the anus and advanced to the the cecum, identified                            by appendiceal orifice and ileocecal valve. The                            colonoscopy was performed without difficulty. The                            patient tolerated the procedure well. The quality                            of the bowel preparation was good. The ileocecal                            valve, appendiceal orifice, and rectum were                            photographed. Scope In: 9:13:44 AM Scope Out: 9:25:55 AM Scope Withdrawal Time: 0 hours 7 minutes 56 seconds  Total Procedure Duration: 0 hours 12 minutes 11 seconds  Findings:                 The perianal and digital rectal examinations were                            normal.  A 3 mm polyp was found in the sigmoid colon. The                            polyp was sessile. The polyp was removed with a                            cold snare. Resection and retrieval were complete.                           Non-bleeding internal hemorrhoids were found during                            retroflexion. The hemorrhoids were small. Complications:            No immediate complications. Estimated Blood Loss:     Estimated blood loss was minimal. Impression:               - One 3 mm polyp in the sigmoid colon, removed with                            a cold snare. Resected and retrieved.                           - Non-bleeding internal hemorrhoids. Recommendation:           - Patient has a contact number available for                            emergencies. The signs and symptoms of potential                            delayed complications were discussed with the                             patient. Return to normal activities tomorrow.                            Written discharge instructions were provided to the                            patient.                           - Resume previous diet.                           - Continue present medications.                           - Await pathology results.                           - Repeat colonoscopy in 5 years for surveillance                            based on pathology results. Guillaume Weninger V.  Lavon Paganini, MD 01/09/2023 9:30:38 AM This report has been signed electronically.

## 2023-01-09 NOTE — Patient Instructions (Signed)

## 2023-01-09 NOTE — Progress Notes (Signed)
[facility] History and Physical   Primary Care Physician:  Karie Georges, MD   Reason for Procedure:  History of adenomatous colon polyps  Plan:    Surveillance colonoscopy with possible interventions as needed     HPI: Becky Moore is a very pleasant 51 y.o. female here for surveillance colonoscopy. Denies any nausea, vomiting, abdominal pain, melena or bright red blood per rectum  The risks and benefits as well as alternatives of endoscopic procedure(s) have been discussed and reviewed. All questions answered. The patient agrees to proceed.    Past Medical History:  Diagnosis Date   GERD (gastroesophageal reflux disease)    Nissen corrected    Hyperlipidemia    Vertigo     Past Surgical History:  Procedure Laterality Date   CHOLECYSTECTOMY     COLONOSCOPY     HERNIA REPAIR     POLYPECTOMY     WISDOM TOOTH EXTRACTION      Prior to Admission medications   Medication Sig Start Date End Date Taking? Authorizing Provider  atorvastatin (LIPITOR) 40 MG tablet Take 1 tablet (40 mg total) by mouth daily. Please make appointment for further refills 12/21/22  Yes Little Ishikawa, MD  finasteride (PROSCAR) 5 MG tablet Take 5 mg by mouth daily. 07/10/19  Yes [provider]  minoxidil (ROGAINE WOMENS) 2 % external solution Rogaine Womens 08/30/20  Yes [provider]  diclofenac Sodium (VOLTAREN) 1 % GEL APPLY 2 GRAMS TO THE AFFECTED AREA(S) BY TOPICAL ROUTE 4 TIMES PER DAY    [provider]  fluticasone (FLONASE) 50 MCG/ACT nasal spray Place 2 sprays into both nostrils daily. 10/01/16   Gordy Savers, MD  meclizine (ANTIVERT) 12.5 MG tablet Take 1 tablet (12.5 mg total) by mouth 3 (three) times daily as needed for dizziness. 08/09/22   Karie Georges, MD    Current Outpatient Medications  Medication Sig Dispense Refill   atorvastatin (LIPITOR) 40 MG tablet Take 1 tablet (40 mg total) by mouth daily. Please make  appointment for further refills 90 tablet 0   finasteride (PROSCAR) 5 MG tablet Take 5 mg by mouth daily.     minoxidil (ROGAINE WOMENS) 2 % external solution Rogaine Womens     diclofenac Sodium (VOLTAREN) 1 % GEL APPLY 2 GRAMS TO THE AFFECTED AREA(S) BY TOPICAL ROUTE 4 TIMES PER DAY     fluticasone (FLONASE) 50 MCG/ACT nasal spray Place 2 sprays into both nostrils daily. 16 g 6   meclizine (ANTIVERT) 12.5 MG tablet Take 1 tablet (12.5 mg total) by mouth 3 (three) times daily as needed for dizziness. 30 tablet 2   No current facility-administered medications for this visit.    Allergies as of 01/09/2023   (No Known Allergies)    Family History  Problem Relation Age of Onset   Diabetes Mother    Colon polyps Mother    Colon polyps Sister    Colon cancer Neg Hx    Esophageal cancer Neg Hx    Rectal cancer Neg Hx    Stomach cancer Neg Hx     Social History   Socioeconomic History   Marital status: Single    Spouse name: Not on file   Number of children: Not on file   Years of education: Not on file   Highest education level: Not on file  Occupational History   Not on file  Tobacco Use   Smoking status: Former    Packs/day: .25    Types:  Cigarettes    Quit date: 03/22/2015    Years since quitting: 7.8   Smokeless tobacco: Never   Tobacco comments:    on Chantix x 1 month  Vaping Use   Vaping Use: Never used  Substance and Sexual Activity   Alcohol use: Yes    Alcohol/week: 0.0 standard drinks of alcohol    Comment: once a month   Drug use: No   Sexual activity: Not Currently    Birth control/protection: None  Other Topics Concern   Not on file  Social History Narrative   Some college    Social Determinants of Health   Financial Resource Strain: Not on file  Food Insecurity: Not on file  Transportation Needs: Not on file  Physical Activity: Not on file  Stress: Not on file  Social Connections: Not on file  Intimate Partner Violence: Not on file     Review of Systems:  All other review of systems negative except as mentioned in the HPI.  Physical Exam: Vital signs in last 24 hours: Blood Pressure 119/74   Pulse 81   Temperature (Abnormal) 97.5 F (36.4 C)   Height 5\' 4"  (1.626 m)   Weight 215 lb (97.5 kg)   Last Menstrual Period 12/11/2017 (Approximate)   Oxygen Saturation 97%   Body Mass Index 36.90 kg/m  General:   Alert, NAD Lungs:  Clear .   Heart:  Regular rate and rhythm Abdomen:  Soft, nontender and nondistended. Neuro/Psych:  Alert and cooperative. Normal mood and affect. A and O x 3  Reviewed labs, radiology imaging, old records and pertinent past GI work up  Patient is appropriate for planned procedure(s) and anesthesia in an ambulatory setting   K. Scherry Ran , MD [phone]

## 2023-01-09 NOTE — Progress Notes (Signed)
Pt's states no medical or surgical changes since previsit or office visit. 

## 2023-01-09 NOTE — Progress Notes (Signed)
Called to room to assist during endoscopic procedure.  Patient ID and intended procedure confirmed with present staff. Received instructions for my participation in the procedure from the performing physician.  

## 2023-01-09 NOTE — Progress Notes (Signed)
Report given to PACU, vss 

## 2023-01-10 ENCOUNTER — Telehealth: Payer: Self-pay | Admitting: *Deleted

## 2023-01-10 NOTE — Telephone Encounter (Signed)
  Follow up Call-     01/09/2023    8:36 AM  Call back number  Post procedure Call Back phone  # son in law Sharlyne Pacas  Permission to leave phone message Yes     Patient questions:  Do you have a fever, pain , or abdominal swelling? No. Pain Score  0 *  Have you tolerated food without any problems? Yes.    Have you been able to return to your normal activities? Yes.    Do you have any questions about your discharge instructions: Diet   No. Medications  No. Follow up visit  No.  Do you have questions or concerns about your Care? No.  Actions: * If pain score is 4 or above: No action needed, pain <4.

## 2023-01-31 ENCOUNTER — Encounter: Payer: Self-pay | Admitting: Gastroenterology

## 2023-02-07 ENCOUNTER — Ambulatory Visit (INDEPENDENT_AMBULATORY_CARE_PROVIDER_SITE_OTHER): Payer: BC Managed Care – PPO | Admitting: Family Medicine

## 2023-02-07 ENCOUNTER — Encounter: Payer: Self-pay | Admitting: Family Medicine

## 2023-02-07 VITALS — BP 110/70 | HR 70 | Temp 97.8°F | Ht 64.0 in | Wt 215.8 lb

## 2023-02-07 DIAGNOSIS — R7303 Prediabetes: Secondary | ICD-10-CM

## 2023-02-07 DIAGNOSIS — Z6837 Body mass index (BMI) 37.0-37.9, adult: Secondary | ICD-10-CM

## 2023-02-07 DIAGNOSIS — E669 Obesity, unspecified: Secondary | ICD-10-CM

## 2023-02-07 DIAGNOSIS — E559 Vitamin D deficiency, unspecified: Secondary | ICD-10-CM | POA: Diagnosis not present

## 2023-02-07 LAB — POCT GLYCOSYLATED HEMOGLOBIN (HGB A1C): Hemoglobin A1C: 6.3 % — AB (ref 4.0–5.6)

## 2023-02-07 MED ORDER — ZEPBOUND 2.5 MG/0.5ML ~~LOC~~ SOAJ: 2.5000 mg | SUBCUTANEOUS | 0 refills | Status: DC

## 2023-02-07 MED ORDER — ZEPBOUND 7.5 MG/0.5ML ~~LOC~~ SOAJ: 7.5000 mg | SUBCUTANEOUS | 0 refills | Status: DC

## 2023-02-07 MED ORDER — ZEPBOUND 5 MG/0.5ML ~~LOC~~ SOAJ: 5.0000 mg | SUBCUTANEOUS | 0 refills | Status: DC

## 2023-02-07 NOTE — Patient Instructions (Addendum)
Berberine-- take 500- 600 mg 30 minutes before meals  Total calories per day: 1800  Total carbs: 60-70 grams per day  Total protein per day: 100 grams per day  Try out a couple different apps to help you monitor your calories and carbs.   Try the fairlife protein shakes -- [employer] or [employer] -- 30 grams of protein and low in sugar.

## 2023-02-07 NOTE — Assessment & Plan Note (Addendum)
I have had an extensive 30 minute conversation today with the patient about healthy eating habits, exercise, calorie and carb goals for sustainable and successful weight loss. I gave the patient caloric and protein daily intake values as well as described the importance of increasing fiber and water intake. I discussed weight loss medications that could be used in the treatment of this patient. Handouts on low carb eating were given to the patient.    Total calories per day: 1800  Total carbs: 60-70 grams per day  Total protein per day: 100 grams per day  Patient has already failed diet and exercise, has been doing this for several months without any weight loss. I will prescribed Zepbound 2.5 mg weekly and titrate up. Will attempt to get this approved for her.

## 2023-02-07 NOTE — Progress Notes (Signed)
Established Patient Office Visit  Subjective   Patient ID: Becky Moore, female    DOB: 16-Mar-1971  Age: 52 y.o. MRN: [id number]  Chief Complaint  Patient presents with   Medical Management of Chronic Issues    Patient is here for 6 month follow up today. Patient has questions about her vitamin D, states that her previous doctor put her on it for low levels, states that it has been a while and was worried it was low again.   HLD- patient continue on statin medication. She reports no side effects. We reviewed her lipid panel findings today in office.   Prediabetes - A1C today remains at 6.3, pt reports she is cutting out a lot of sugar and tried to reduce bread in her diet as well.   Patient has expressed an interest in weight loss medication. She reports she is exercising twice a week and trying to follow up low carb diet, however she is not able to lose weight. States that she works 11 hours a day and does not have much time to exercise. Has tried weight watchers also.   Pt has prediabetes and high cholesterol as her co morbid conditions.      Current Outpatient Medications  Medication Instructions   atorvastatin (LIPITOR) 40 mg, Oral, Daily, Please make appointment for further refills   diclofenac Sodium (VOLTAREN) 1 % GEL APPLY 2 GRAMS TO THE AFFECTED AREA(S) BY TOPICAL ROUTE 4 TIMES PER DAY   finasteride (PROSCAR) 5 mg, Oral, Daily   fluticasone (FLONASE) 50 MCG/ACT nasal spray 2 sprays, Each Nare, Daily   meclizine (ANTIVERT) 12.5 mg, Oral, 3 times daily PRN   Zepbound 2.5 mg, Subcutaneous, Weekly   Zepbound 5 mg, Subcutaneous, Weekly   Zepbound 7.5 mg, Subcutaneous, Weekly, To be filled after the 5 mg is complete    Patient Active Problem List   Diagnosis Date Noted   Obesity (BMI 30-39.9) 02/07/2023   Prediabetes 08/09/2022   Atrial septal aneurysm 08/09/2022   Back pain 11/16/2016   Hx of adenomatous colonic polyps 10/01/2016   Dyslipidemia 10/01/2016   Benign  positional vertigo, unspecified laterality 12/30/2009   DYSPNEA 12/01/2009   HIATAL HERNIA 08/31/2009   CHOLECYSTECTOMY, LAPAROSCOPIC, HX OF 08/18/2009   DYSPHAGIA UNSPECIFIED 08/15/2009   SORE THROAT 12/08/2007   GERD 09/04/2007      Review of Systems  All other systems reviewed and are negative.     Objective:     BP 110/70 (BP Location: Left Arm, Patient Position: Sitting, Cuff Size: Large)   Pulse 70   Temp 97.8 F (36.6 C) (Axillary)   Ht 5\' 4"  (1.626 m)   Wt 215 lb 12.8 oz (97.9 kg)   LMP 12/11/2017 (Approximate)   SpO2 98%   BMI 37.04 kg/m    Physical Exam Vitals reviewed.  Constitutional:      Appearance: Normal appearance. She is obese.  Eyes:     Conjunctiva/sclera: Conjunctivae normal.  Cardiovascular:     Rate and Rhythm: Normal rate and regular rhythm.  Pulmonary:     Effort: Pulmonary effort is normal.     Breath sounds: Normal breath sounds. No wheezing.  Neurological:     General: No focal deficit present.     Mental Status: She is alert and oriented to person, place, and time. Mental status is at baseline.  Psychiatric:        Mood and Affect: Mood normal.        Behavior: Behavior normal.  Results for orders placed or performed in visit on 02/07/23  POC HgB A1c  Result Value Ref Range   Hemoglobin A1C 6.3 (A) 4.0 - 5.6 %   HbA1c POC (<> result, manual entry)     HbA1c, POC (prediabetic range)     HbA1c, POC (controlled diabetic range)      Last lipids Lab Results  Component Value Date   CHOL 126 01/07/2023   HDL 38.90 (L) 01/07/2023   LDLCALC 66 01/07/2023   TRIG 107.0 01/07/2023   CHOLHDL 3 01/07/2023      The ASCVD Risk score (Arnett DK, et al., 2019) failed to calculate for the following reasons:   The valid total cholesterol range is 130 to 320 mg/dL    Assessment & Plan:  Prediabetes Assessment & Plan: A1C remains at 6.3, still not in the full diabetes category. I continued to counsel patient on the low carb diet  and increasing exercise. Recheck again in 6 months  Orders: -     POCT glycosylated hemoglobin (Hb A1C)  Vitamin D deficiency -     VITAMIN D 25 Hydroxy (Vit-D Deficiency, Fractures); Future  Obesity (BMI 30-39.9) Assessment & Plan: I have had an extensive 30 minute conversation today with the patient about healthy eating habits, exercise, calorie and carb goals for sustainable and successful weight loss. I gave the patient caloric and protein daily intake values as well as described the importance of increasing fiber and water intake. I discussed weight loss medications that could be used in the treatment of this patient. Handouts on low carb eating were given to the patient.    Total calories per day: 1800  Total carbs: 60-70 grams per day  Total protein per day: 100 grams per day  Patient has already failed diet and exercise, has been doing this for several months without any weight loss. I will prescribed Zepbound 2.5 mg weekly and titrate up. Will attempt to get this approved for her.   Orders: -     Zepbound; Inject 2.5 mg into the skin once a week.  Dispense: 2 mL; Refill: 0 -     Zepbound; Inject 5 mg into the skin once a week.  Dispense: 2 mL; Refill: 0 -     Zepbound; Inject 7.5 mg into the skin once a week. To be filled after the 5 mg is complete  Dispense: 2 mL; Refill: 0     Return in about 3 months (around 05/10/2023).    Karie Georges, MD

## 2023-02-07 NOTE — Assessment & Plan Note (Signed)
A1C remains at 6.3, still not in the full diabetes category. I continued to counsel patient on the low carb diet and increasing exercise. Recheck again in 6 months

## 2023-02-15 ENCOUNTER — Other Ambulatory Visit: Payer: BC Managed Care – PPO | Admitting: Hematology and Oncology

## 2023-02-15 DIAGNOSIS — Z1283 Encounter for screening for malignant neoplasm of skin: Secondary | ICD-10-CM

## 2023-02-15 NOTE — Progress Notes (Signed)
Type of Exam: Specific Lesion   Exam comments: Right arm with area noted for years without change, no concerning findings.   Presumptive diagnosis: no significant findings Biopsy recommended? No Referred? No

## 2023-02-19 ENCOUNTER — Telehealth: Payer: Self-pay | Admitting: Family Medicine

## 2023-02-19 NOTE — Telephone Encounter (Signed)
Pt call and stated she want dr.Michael to call her in something else because Tirzepatide cost to much it cost 500.00/mm

## 2023-02-20 NOTE — Telephone Encounter (Signed)
Is this after the medication went through the prior auth?

## 2023-02-20 NOTE — Telephone Encounter (Signed)
Spoke with [place] at [facility] and she stated no message was received stating a PA is needed as the Rx costs $1000 with insurance.  Message sent to PCP.

## 2023-02-26 ENCOUNTER — Other Ambulatory Visit: Payer: Self-pay | Admitting: Family Medicine

## 2023-02-26 ENCOUNTER — Encounter: Payer: Self-pay | Admitting: Family Medicine

## 2023-03-06 ENCOUNTER — Other Ambulatory Visit: Payer: Self-pay | Admitting: Cardiology

## 2023-04-08 ENCOUNTER — Other Ambulatory Visit: Payer: Self-pay | Admitting: Cardiology

## 2023-06-10 ENCOUNTER — Ambulatory Visit (INDEPENDENT_AMBULATORY_CARE_PROVIDER_SITE_OTHER): Payer: BC Managed Care – PPO | Admitting: Family Medicine

## 2023-06-10 ENCOUNTER — Encounter: Payer: Self-pay | Admitting: Family Medicine

## 2023-06-10 VITALS — BP 116/80 | HR 77 | Temp 98.1°F | Ht 64.0 in | Wt 208.9 lb

## 2023-06-10 DIAGNOSIS — M549 Dorsalgia, unspecified: Secondary | ICD-10-CM | POA: Diagnosis not present

## 2023-06-10 DIAGNOSIS — E669 Obesity, unspecified: Secondary | ICD-10-CM

## 2023-06-10 DIAGNOSIS — R7303 Prediabetes: Secondary | ICD-10-CM

## 2023-06-10 DIAGNOSIS — E559 Vitamin D deficiency, unspecified: Secondary | ICD-10-CM

## 2023-06-10 DIAGNOSIS — G8929 Other chronic pain: Secondary | ICD-10-CM

## 2023-06-10 DIAGNOSIS — L649 Androgenic alopecia, unspecified: Secondary | ICD-10-CM

## 2023-06-10 LAB — POCT GLYCOSYLATED HEMOGLOBIN (HGB A1C): Hemoglobin A1C: 6.2 % — AB (ref 4.0–5.6)

## 2023-06-10 MED ORDER — FINASTERIDE 5 MG PO TABS
5.0000 mg | ORAL_TABLET | Freq: Every day | ORAL | 1 refills | Status: DC
Start: 2023-06-10 — End: 2023-12-06

## 2023-06-10 MED ORDER — DICLOFENAC SODIUM 1 % EX GEL: CUTANEOUS | 5 refills | Status: DC

## 2023-06-10 NOTE — Progress Notes (Unsigned)
Established Patient Office Visit  Subjective   Patient ID: Becky Moore, female    DOB: [birth year]  Age: 52 y.o. MRN: [id number]  Chief Complaint  Patient presents with   Medical Management of Chronic Issues   Medication Refill    Pt is here for follow up. States that she was unable to get the zepbound-- insurance did not cover this medication. She reports she has been exercising and watching her diet instead, has lost about 7 pounds since her last visit.   PreDM-- A1C today is 6.2, stable from previous labs. She is not currently on medication for this. She is due for her annual CMP for surveillance of kidney function.   Pt needs refills on her finasteride and diclofenac gel. Reports that she is taking the finasteride for hair loss and the diclofenac gel for her chronic back pain. States these medications seem to be working well for her currently.     Current Outpatient Medications  Medication Instructions   atorvastatin (LIPITOR) 40 MG tablet TAKE 1 TABLET BY MOUTH DAILY. PATIENT NEEDS TO MAKE APPOINTMENT FOR FURTHER REFILLS.   diclofenac Sodium (VOLTAREN) 1 % GEL APPLY 2 GRAMS TO THE AFFECTED AREA(S) BY TOPICAL ROUTE 4 TIMES PER DAY   finasteride (PROSCAR) 5 mg, Oral, Daily   fluticasone (FLONASE) 50 MCG/ACT nasal spray 2 sprays, Each Nare, Daily   meclizine (ANTIVERT) 12.5 mg, Oral, 3 times daily PRN    Patient Active Problem List   Diagnosis Date Noted   Obesity (BMI 30-39.9) 02/07/2023   Prediabetes 08/09/2022   Atrial septal aneurysm 08/09/2022   Back pain 11/16/2016   Hx of adenomatous colonic polyps 10/01/2016   Dyslipidemia 10/01/2016   Benign positional vertigo, unspecified laterality 12/30/2009   DYSPNEA 12/01/2009   HIATAL HERNIA 08/31/2009   CHOLECYSTECTOMY, LAPAROSCOPIC, HX OF 08/18/2009   DYSPHAGIA UNSPECIFIED 08/15/2009   SORE THROAT 12/08/2007   GERD 09/04/2007      Review of Systems  All other systems reviewed and are negative.     Objective:      BP 116/80 (BP Location: Right Arm, Patient Position: Sitting, Cuff Size: Large)   Pulse 77   Temp 98.1 F (36.7 C) (Oral)   Ht 5\' 4"  (1.626 m)   Wt 208 lb 14.4 oz (94.8 kg)   LMP 12/11/2017 (Approximate)   SpO2 96%   BMI 35.86 kg/m    Physical Exam Vitals reviewed.  Constitutional:      Appearance: Normal appearance. She is obese.  Eyes:     Conjunctiva/sclera: Conjunctivae normal.  Cardiovascular:     Rate and Rhythm: Normal rate and regular rhythm.  Pulmonary:     Effort: Pulmonary effort is normal.     Breath sounds: Normal breath sounds. No wheezing.  Neurological:     General: No focal deficit present.     Mental Status: She is alert and oriented to person, place, and time. Mental status is at baseline.  Psychiatric:        Mood and Affect: Mood normal.        Behavior: Behavior normal.      Results for orders placed or performed in visit on 06/10/23  POC HgB A1c  Result Value Ref Range   Hemoglobin A1C 6.2 (A) 4.0 - 5.6 %   HbA1c POC (<> result, manual entry)     HbA1c, POC (prediabetic range)     HbA1c, POC (controlled diabetic range)        The ASCVD Risk score (Arnett  DK, et al., 2019) failed to calculate for the following reasons:   The valid total cholesterol range is 130 to 320 mg/dL    Assessment & Plan:  Prediabetes Assessment & Plan: Chronic, A1C is stable at 6.2, I encouraged her to continue her efforts with the low carb diet and exercise. We discussed using appetite suppressants for weight loss however pt does not wish to start this medication at this time.   Orders: -     POCT glycosylated hemoglobin (Hb A1C) -     Comprehensive metabolic panel; Future  Obesity (BMI 30-39.9) Checking annual labs today for surveillance-- TSH has not been evaluated in some time. Pt was unable to get GLP medication due to lack of coverage. I encouraged her to continue exercise and dietary changes that we discussed previously. I did offer her appetite  suppressants, however pt declined at this time.  -     TSH; Future  Vitamin D deficiency -     VITAMIN D 25 Hydroxy (Vit-D Deficiency, Fractures); Future  Chronic right-sided back pain, unspecified back location Assessment & Plan: Stable, chronic, continue diclofenac gel as needed for pain  Orders: -     Diclofenac Sodium; APPLY 2 GRAMS TO THE AFFECTED AREA(S) BY TOPICAL ROUTE 4 TIMES PER DAY  Dispense: 150 g; Refill: 5  Androgenic alopecia -     Finasteride; Take 1 tablet (5 mg total) by mouth daily.  Dispense: 90 tablet; Refill: 1   I spent 30 minutes with patient providing counseling on diet and exercise, reviewing labs, ordering labs, and discussing other chronic medical issues.   Return in about 6 months (around 12/09/2023) for annual physical exam.    Karie Georges, MD

## 2023-06-12 NOTE — Assessment & Plan Note (Signed)
Stable, chronic, continue diclofenac gel as needed for pain

## 2023-06-12 NOTE — Assessment & Plan Note (Signed)
A1C is stable at 6.2, I encouraged her to continue her efforts with the low carb diet and exercise. We discussed using appetite suppressants for weight loss however pt does not wish to start this medication at this time.

## 2023-06-13 ENCOUNTER — Other Ambulatory Visit: Payer: Self-pay | Admitting: Cardiology

## 2023-06-13 ENCOUNTER — Telehealth: Payer: Self-pay | Admitting: *Deleted

## 2023-06-13 ENCOUNTER — Other Ambulatory Visit (HOSPITAL_COMMUNITY): Payer: Self-pay

## 2023-06-13 ENCOUNTER — Telehealth: Payer: Self-pay

## 2023-06-13 NOTE — Telephone Encounter (Signed)
PA request has been Submitted. New Encounter created for follow up. For additional info see Pharmacy Prior Auth telephone encounter from 06/13/23.

## 2023-06-13 NOTE — Telephone Encounter (Signed)
Pharmacy Patient Advocate Encounter   Received notification from Pt Calls Messages that prior authorization for Diclofenac Sodium 1% gel is required/requested.   Insurance verification completed.   The patient is insured through [facility] .   Per test claim: PA required; PA submitted to above mentioned insurance via CoverMyMeds Key/confirmation #/EOC [id number] Status is pending

## 2023-06-13 NOTE — Telephone Encounter (Signed)
[facility] for Diclofenac gel 1%.  Message sent to prior auth team.

## 2023-06-14 NOTE — Telephone Encounter (Signed)
Pharmacy Patient Advocate Encounter  Received notification from [facility] that Prior Authorization for Diclofenac Sodium 1% gel  has been DENIED.  See denial reason below. No denial letter attached in CMM. Will attach denial letter to Media tab once received.   PA #/Case ID/Reference #: [id number]

## 2023-06-14 NOTE — Telephone Encounter (Signed)
Patient informed of the message below.

## 2023-06-14 NOTE — Telephone Encounter (Signed)
The diclofenac gel is over the counter now so she may purchase the medication without a prescrtipion if she still needs the medication.

## 2023-06-25 ENCOUNTER — Other Ambulatory Visit (INDEPENDENT_AMBULATORY_CARE_PROVIDER_SITE_OTHER): Payer: BC Managed Care – PPO

## 2023-06-25 DIAGNOSIS — R7303 Prediabetes: Secondary | ICD-10-CM | POA: Diagnosis not present

## 2023-06-25 DIAGNOSIS — E559 Vitamin D deficiency, unspecified: Secondary | ICD-10-CM

## 2023-06-25 DIAGNOSIS — E669 Obesity, unspecified: Secondary | ICD-10-CM | POA: Diagnosis not present

## 2023-06-25 LAB — COMPREHENSIVE METABOLIC PANEL
ALT: 20 U/L (ref 0–35)
AST: 19 U/L (ref 0–37)
Albumin: 4.5 g/dL (ref 3.5–5.2)
Alkaline Phosphatase: 113 U/L (ref 39–117)
BUN: 9 mg/dL (ref 6–23)
CO2: 29 meq/L (ref 19–32)
Calcium: 9.4 mg/dL (ref 8.4–10.5)
Chloride: 105 meq/L (ref 96–112)
Creatinine, Ser: 0.84 mg/dL (ref 0.40–1.20)
GFR: 78.5 mL/min (ref >60.00)
Glucose, Bld: 101 mg/dL — ABNORMAL HIGH (ref 70–99)
Potassium: 4.1 meq/L (ref 3.5–5.1)
Sodium: 141 meq/L (ref 135–145)
Total Bilirubin: 0.5 mg/dL (ref 0.2–1.2)
Total Protein: 6.8 g/dL (ref 6.0–8.3)

## 2023-06-25 LAB — VITAMIN D 25 HYDROXY (VIT D DEFICIENCY, FRACTURES): VITD: 26.36 ng/mL — ABNORMAL LOW (ref 30.00–100.00)

## 2023-06-25 LAB — TSH: TSH: 2.39 u[IU]/mL (ref 0.35–5.50)

## 2023-06-27 ENCOUNTER — Encounter: Payer: Self-pay | Admitting: Family Medicine

## 2023-07-15 ENCOUNTER — Other Ambulatory Visit: Payer: Self-pay | Admitting: Cardiology

## 2023-07-17 DIAGNOSIS — Z0279 Encounter for issue of other medical certificate: Secondary | ICD-10-CM

## 2023-07-31 ENCOUNTER — Telehealth: Payer: Self-pay

## 2023-07-31 NOTE — Telephone Encounter (Signed)
Copied from CRM [id number]. Topic: General - Inquiry >> Jul 31, 2023  1:38 PM [place] wrote: Reason for CRM: Patient was having paperwork for [facility] sent over from [employer], wanting to know if she should just bring the paper work and bring it in to have it filled out and send in.

## 2023-08-01 ENCOUNTER — Encounter: Payer: Self-pay | Admitting: Family Medicine

## 2023-08-01 DIAGNOSIS — Z0279 Encounter for issue of other medical certificate: Secondary | ICD-10-CM

## 2023-08-01 NOTE — Telephone Encounter (Signed)
PCP completed a letter and this was faxed to [employer] at [phone] along with the previously faxed paperwork.  Paperwork was sent to be scanned also.  Left a detailed message with this information at the patient's cell number.

## 2023-08-01 NOTE — Telephone Encounter (Signed)
I'm pretty sure I filled this out for her already-- they sent it back for some reason but I'm not sure why-- I thought you had already faxed it-- can you check scanning to make sure? Thanks!

## 2023-08-09 ENCOUNTER — Other Ambulatory Visit: Payer: Self-pay | Admitting: Cardiology

## 2023-08-26 ENCOUNTER — Other Ambulatory Visit: Payer: Self-pay | Admitting: Cardiology

## 2023-08-27 ENCOUNTER — Other Ambulatory Visit: Payer: Self-pay | Admitting: *Deleted

## 2023-08-27 NOTE — Telephone Encounter (Signed)
 Dr. Campbell Lerner pt. She is passed her 3rd attempt. Does Dr. Bjorn Pippin want to refill? Please advise.

## 2023-08-28 NOTE — Telephone Encounter (Signed)
 Patient needs appointment, can we schedule?

## 2023-08-30 ENCOUNTER — Other Ambulatory Visit: Payer: Self-pay | Admitting: *Deleted

## 2023-08-30 ENCOUNTER — Encounter: Payer: Self-pay | Admitting: *Deleted

## 2023-08-30 MED ORDER — ATORVASTATIN CALCIUM 40 MG PO TABS: 40.0000 mg | ORAL_TABLET | Freq: Every day | ORAL | 0 refills | Status: DC

## 2023-09-28 ENCOUNTER — Emergency Department (HOSPITAL_BASED_OUTPATIENT_CLINIC_OR_DEPARTMENT_OTHER)

## 2023-09-28 ENCOUNTER — Encounter (HOSPITAL_BASED_OUTPATIENT_CLINIC_OR_DEPARTMENT_OTHER): Payer: Self-pay

## 2023-09-28 ENCOUNTER — Emergency Department (HOSPITAL_BASED_OUTPATIENT_CLINIC_OR_DEPARTMENT_OTHER)
Admission: EM | Admit: 2023-09-28 | Discharge: 2023-09-28 | Disposition: A | Discharge status: Home / Self Care | Attending: Emergency Medicine | Admitting: Emergency Medicine

## 2023-09-28 ENCOUNTER — Other Ambulatory Visit: Payer: Self-pay

## 2023-09-28 DIAGNOSIS — M25562 Pain in left knee: Secondary | ICD-10-CM | POA: Diagnosis present

## 2023-09-28 DIAGNOSIS — M542 Cervicalgia: Secondary | ICD-10-CM | POA: Diagnosis not present

## 2023-09-28 DIAGNOSIS — Y92410 Unspecified street and highway as the place of occurrence of the external cause: Secondary | ICD-10-CM | POA: Diagnosis not present

## 2023-09-28 MED ORDER — NAPROXEN 500 MG PO TABS: 500.0000 mg | ORAL_TABLET | Freq: Two times a day (BID) | ORAL | 0 refills | Status: DC

## 2023-09-28 MED ORDER — METHOCARBAMOL 500 MG PO TABS: 500.0000 mg | ORAL_TABLET | Freq: Two times a day (BID) | ORAL | 0 refills | Status: DC
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
## 2023-09-28 NOTE — ED Notes (Signed)
 Reviewed discharge instruction, follow up and medications.  Pt states understanding. Ambulatory at discharge

## 2023-09-28 NOTE — ED Notes (Signed)
 ED Provider at bedside.
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
## 2023-10-03 ENCOUNTER — Telehealth: Payer: Self-pay | Admitting: *Deleted

## 2023-10-03 NOTE — Telephone Encounter (Signed)
 PCP received fax from [facility] with a list of pending absences and wanted to know the reason for the absences, did the patient seek medical care as she needs this to complete paperwork.  Spoke with the patient and she stated she was out due to back issues and did not seek medical care.  This information was added to the fax and PCP stated the patient needs an appt since she was not seen there is no documentation of a diagnosis/work up or plan and the original FMLA was for vertigo.  Patient was informed of this and an appt was scheduled on 4/10.

## 2023-10-06 NOTE — Progress Notes (Deleted)
 Cardiology Office Note:    Date:  10/06/2023   ID:  Becky Moore, DOB [birth year], MRN [id number]  PCP:  Karie Georges, MD  Cardiologist:  None  Electrophysiologist:  None   Referring MD: Karie Georges, MD   No chief complaint on file.   History of Present Illness:    Becky Moore is a 53 y.o. female with a hx of hyperlipdemia, prediabetes who presents for follow-up.  She was referred by Dr. Salomon Fick for an evaluation of cardiovascular risk assessment, initially seen on 07/15/2019.  She had recently been started on lovastatin but did not want to start taking medication.  Her ASCVD risk score was low, so calcium score was recommended for further risk stratification to see if statin was indicated.  Calcium score on 07/22/2019 with 88 (97th percentile).   TTE in 06/16/15 shows Normal LV systolic function; grade 1 diastolic dysfunction; trace MR and TR; atrial septal aneurysm.  Calcium score on 07/22/2019 with 88 (97th percentile).  Coronary CTA on 10/26/2019 showed nonobstructive CAD with calcified plaque in the proximal LAD causing mild (25 to 49%) stenosis.  Since last clinic visit,  she reports that she has been doing well.  Denies any chest pain, dyspnea, lower extremity edema, or palpitations.  Has lost 15 pounds.  Has been doing high intensity exercises.  Recently started on Ozempic.  She does report some lightheadedness, denies any syncope.   Past Medical History:  Diagnosis Date   Aneurysm (HCC)    per pt in chest, states it is stable   GERD (gastroesophageal reflux disease)    Nissen corrected    Hyperlipidemia    Vertigo     Past Surgical History:  Procedure Laterality Date   CHOLECYSTECTOMY     COLONOSCOPY     HERNIA REPAIR     POLYPECTOMY     WISDOM TOOTH EXTRACTION      Current Medications: No outpatient medications have been marked as taking for the 10/08/23 encounter (Appointment) with Little Ishikawa, MD.     Allergies:   Patient has no known  allergies.   Social History   Socioeconomic History   Marital status: Single    Spouse name: Not on file   Number of children: Not on file   Years of education: Not on file   Highest education level: Associate degree: academic program  Occupational History   Not on file  Tobacco Use   Smoking status: Some Days    Current packs/day: 0.00    Types: Cigarettes    Last attempt to quit: 03/22/2015    Years since quitting: 8.5   Smokeless tobacco: Never   Tobacco comments:    on Chantix x 1 month  Vaping Use   Vaping status: Never Used  Substance and Sexual Activity   Alcohol use: Yes    Alcohol/week: 0.0 standard drinks of alcohol    Comment: once a month   Drug use: No   Sexual activity: Not Currently    Birth control/protection: None  Other Topics Concern   Not on file  Social History Narrative   Some college    Social Drivers of Health   Financial Resource Strain: Low Risk  (02/03/2023)   Overall Financial Resource Strain (CARDIA)    Difficulty of Paying Living Expenses: Not hard at all  Food Insecurity: No Food Insecurity (06/06/2023)   Hunger Vital Sign    Worried About Running Out of Food in the Last Year: Never true  Ran Out of Food in the Last Year: Never true  Transportation Needs: No Transportation Needs (06/06/2023)   PRAPARE - Administrator, Civil Service (Medical): No    Lack of Transportation (Non-Medical): No  Physical Activity: Sufficiently Active (06/06/2023)   Exercise Vital Sign    Days of Exercise per Week: 6 days    Minutes of Exercise per Session: 60 min  Stress: No Stress Concern Present (06/06/2023)   [employer] of Occupational Health - Occupational Stress Questionnaire    Feeling of Stress : Not at all  Social Connections: Socially Isolated (06/06/2023)   Social Connection and Isolation Panel [NHANES]    Frequency of Communication with Friends and Family: Three times a week    Frequency of Social Gatherings with Friends and  Family: Never    Attends Religious Services: Never    Database administrator or Organizations: No    Attends Engineer, structural: Not on file    Marital Status: Never married     Family History: The patient's *family history includes Colon polyps in her mother and sister; Diabetes in her mother. There is no history of Colon cancer, Esophageal cancer, Rectal cancer, or Stomach cancer.  ROS:   Please see the history of present illness.     All other systems reviewed and are negative.  EKGs/Labs/Other Studies Reviewed:    The following studies were reviewed today:   EKG:   12/19/2021: Normal sinus rhythm, rate 64, no ST abnormality  Recent Labs: 06/25/2023: ALT 20; BUN 9; Creatinine, Ser 0.84; Potassium 4.1; Sodium 141; TSH 2.39  Recent Lipid Panel    Component Value Date/Time   CHOL 126 01/07/2023 0759   CHOL 149 01/01/2022 0849   TRIG 107.0 01/07/2023 0759   HDL 38.90 (L) 01/07/2023 0759   HDL 51 01/01/2022 0849   CHOLHDL 3 01/07/2023 0759   VLDL 21.4 01/07/2023 0759   LDLCALC 66 01/07/2023 0759   LDLCALC 80 01/01/2022 0849   LDLCALC 151 (H) 11/14/2017 0921    Physical Exam:    VS:  LMP 12/11/2017 (Approximate)     Wt Readings from Last 3 Encounters:  09/28/23 201 lb (91.2 kg)  06/10/23 208 lb 14.4 oz (94.8 kg)  02/07/23 215 lb 12.8 oz (97.9 kg)     GEN:  Well nourished, well developed in no acute distress HEENT: Normal NECK: No JVD; No carotid bruits CARDIAC: RRR, no murmurs, rubs, gallops RESPIRATORY:  Clear to auscultation without rales, wheezing or rhonchi  ABDOMEN: Soft, non-tender, non-distended MUSCULOSKELETAL:  No edema; No deformity  SKIN: Warm and dry NEUROLOGIC:  Alert and oriented x 3 PSYCHIATRIC:  Normal affect   ASSESSMENT:    No diagnosis found.   PLAN:    CAD:  Calcium score on 07/22/2019 was 88 (97th percentile).  Coronary CTA on 10/26/2019 showed nonobstructive CAD with calcified plaque in the proximal LAD causing mild (25 to  49%) stenosis. -Continue atorvastatin 40 mg daily   Hyperlipidemia: LDL 66 on 01/07/2023.  Continue atorvastatin 40 mg daily.    Lightheadedness: Suspect related to vertigo.  Carotid duplex on 01/08/2020 shows mild bilateral stenosis in carotid arteries  Prediabetes: A1c 6.3% on 02/07/2019  Tobacco use: Quit smoking in July 2022.  Congratulated patient on quitting smoking and encouraged continued cessation  RTC in 1 year***   Medication Adjustments/Labs and Tests Ordered: Current medicines are reviewed at length with the patient today.  Concerns regarding medicines are outlined above.  No orders of the  defined types were placed in this encounter.  No orders of the defined types were placed in this encounter.   There are no Patient Instructions on file for this visit.   Signed, Little Ishikawa, MD  10/06/2023 3:21 PM    [facility]

## 2023-10-08 ENCOUNTER — Other Ambulatory Visit: Payer: Self-pay | Admitting: Cardiology

## 2023-10-08 ENCOUNTER — Ambulatory Visit (HOSPITAL_BASED_OUTPATIENT_CLINIC_OR_DEPARTMENT_OTHER): Payer: BC Managed Care – PPO | Admitting: Cardiology

## 2023-10-10 ENCOUNTER — Ambulatory Visit: Admitting: Family Medicine

## 2023-10-10 VITALS — BP 110/70 | HR 76 | Temp 98.0°F | Ht 64.0 in | Wt 206.3 lb

## 2023-10-10 DIAGNOSIS — G8929 Other chronic pain: Secondary | ICD-10-CM | POA: Diagnosis not present

## 2023-10-10 DIAGNOSIS — M549 Dorsalgia, unspecified: Secondary | ICD-10-CM
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
## 2023-10-11 NOTE — Telephone Encounter (Signed)
 PCP completed the forms, these were faxed to [employer] at [phone] and sent to be scanned into the chart.

## 2023-12-06 ENCOUNTER — Other Ambulatory Visit: Payer: Self-pay | Admitting: Family Medicine

## 2023-12-06 DIAGNOSIS — L649 Androgenic alopecia, unspecified: Secondary | ICD-10-CM

## 2023-12-10 LAB — HM PAP SMEAR
Chlamydia, Swab/Urine, PCR: NEGATIVE
HPV, high-risk: NEGATIVE

## 2024-02-25 NOTE — Progress Notes (Unsigned)
 Cardiology Office Note:    Date:  02/27/2024   ID:  Becky Moore, DOB 1971/04/26, MRN [id number]  PCP:  Ozell Heron HERO, MD  Cardiologist:  None  Electrophysiologist:  None   Referring MD: Ozell Heron HERO, MD   Chief Complaint  Patient presents with   Coronary Artery Disease    History of Present Illness:    Becky Moore is a 53 y.o. female with a hx of hyperlipdemia, prediabetes who presents for follow-up.  She was referred by Dr. Mercer for an evaluation of cardiovascular risk assessment, initially seen on 07/15/2019.  She had recently been started on lovastatin but did not want to start taking medication.  Her ASCVD risk score was low, so calcium  score was recommended for further risk stratification to see if statin was indicated.  Calcium  score on 07/22/2019 with 88 (97th percentile).   TTE in 06/16/15 shows Normal LV systolic function; grade 1 diastolic dysfunction; trace MR and TR; atrial septal aneurysm.  Calcium  score on 07/22/2019 with 88 (97th percentile).  Coronary CTA on 10/26/2019 showed nonobstructive CAD with calcified plaque in the proximal LAD causing mild (25 to 49%) stenosis.  Since last clinic visit, she reports she is doing okay.  Denies any chest pain, dyspnea, lower extremity edema, or palpitations.  Reports some lightheadedness denies any syncope.  States that she started vaping but has not vape for the last month.  She walks her dog for exercise.  Past Medical History:  Diagnosis Date   Aneurysm (HCC)    per pt in chest, states it is stable   GERD (gastroesophageal reflux disease)    Nissen corrected    Hyperlipidemia    Vertigo     Past Surgical History:  Procedure Laterality Date   CHOLECYSTECTOMY     COLONOSCOPY     HERNIA REPAIR     POLYPECTOMY     WISDOM TOOTH EXTRACTION      Current Medications: Current Meds  Medication Sig   atorvastatin  (LIPITOR) 40 MG tablet TAKE 1 TABLET BY MOUTH EVERY DAY   diclofenac  Sodium (VOLTAREN ) 1 %  GEL APPLY 2 GRAMS TO THE AFFECTED AREA(S) BY TOPICAL ROUTE 4 TIMES PER DAY   finasteride  (PROSCAR ) 5 MG tablet TAKE 1 TABLET (5 MG TOTAL) BY MOUTH DAILY.   fluticasone  (FLONASE ) 50 MCG/ACT nasal spray Place 2 sprays into both nostrils daily. (Patient taking differently: Place 2 sprays into both nostrils as needed for allergies.)     Allergies:   Patient has no known allergies.   Social History   Socioeconomic History   Marital status: Single    Spouse name: Not on file   Number of children: Not on file   Years of education: Not on file   Highest education level: Associate degree: occupational, Scientist, product/process development, or vocational program  Occupational History   Not on file  Tobacco Use   Smoking status: Former    Current packs/day: 0.00    Types: Cigarettes    Quit date: 03/22/2015    Years since quitting: 8.9   Smokeless tobacco: Never   Tobacco comments:    on Chantix  x 1 month  Vaping Use   Vaping status: Never Used  Substance and Sexual Activity   Alcohol use: Yes    Alcohol/week: 0.0 standard drinks of alcohol    Comment: once a month   Drug use: No   Sexual activity: Not Currently    Birth control/protection: None  Other Topics Concern   Not on file  Social History Narrative   Some college    Social Drivers of Health   Financial Resource Strain: Low Risk  (10/10/2023)   Overall Financial Resource Strain (CARDIA)    Difficulty of Paying Living Expenses: Not hard at all  Food Insecurity: No Food Insecurity (10/10/2023)   Hunger Vital Sign    Worried About Running Out of Food in the Last Year: Never true    Ran Out of Food in the Last Year: Never true  Transportation Needs: No Transportation Needs (10/10/2023)   PRAPARE - Administrator, Civil Service (Medical): No    Lack of Transportation (Non-Medical): No  Physical Activity: Sufficiently Active (10/10/2023)   Exercise Vital Sign    Days of Exercise per Week: 3 days    Minutes of Exercise per Session: 110 min   Stress: No Stress Concern Present (10/10/2023)   [employer] of Occupational Health - Occupational Stress Questionnaire    Feeling of Stress : Not at all  Social Connections: Unknown (10/10/2023)   Social Connection and Isolation Panel    Frequency of Communication with Friends and Family: More than three times a week    Frequency of Social Gatherings with Friends and Family: Never    Attends Religious Services: Patient declined    Database administrator or Organizations: No    Attends Engineer, structural: Not on file    Marital Status: Never married     Family History: The patient's *family history includes Colon polyps in her mother and sister; Diabetes in her mother. There is no history of Colon cancer, Esophageal cancer, Rectal cancer, or Stomach cancer.  ROS:   Please see the history of present illness.     All other systems reviewed and are negative.  EKGs/Labs/Other Studies Reviewed:    The following studies were reviewed today:   EKG:   12/19/2021: Normal sinus rhythm, rate 64, no ST abnormality 02/27/2024: Normal sinus rhythm, rate 81, no ST abnormality  Recent Labs: 06/25/2023: ALT 20; BUN 9; Creatinine, Ser 0.84; Potassium 4.1; Sodium 141; TSH 2.39  Recent Lipid Panel    Component Value Date/Time   CHOL 126 01/07/2023 0759   CHOL 149 01/01/2022 0849   TRIG 107.0 01/07/2023 0759   HDL 38.90 (L) 01/07/2023 0759   HDL 51 01/01/2022 0849   CHOLHDL 3 01/07/2023 0759   VLDL 21.4 01/07/2023 0759   LDLCALC 66 01/07/2023 0759   LDLCALC 80 01/01/2022 0849   LDLCALC 151 (H) 11/14/2017 0921    Physical Exam:    VS:  BP 112/70 (BP Location: Left Arm, Patient Position: Sitting, Cuff Size: Large)   Pulse 81   Ht 5' 4 (1.626 m)   Wt 209 lb 3.2 oz (94.9 kg)   LMP 12/11/2017 (Approximate)   SpO2 96%   BMI 35.91 kg/m     Wt Readings from Last 3 Encounters:  02/27/24 209 lb 3.2 oz (94.9 kg)  10/10/23 206 lb 4.8 oz (93.6 kg)  09/28/23 201 lb (91.2 kg)      GEN:  Well nourished, well developed in no acute distress HEENT: Normal NECK: No JVD; No carotid bruits CARDIAC: RRR, no murmurs, rubs, gallops RESPIRATORY:  Clear to auscultation without rales, wheezing or rhonchi  ABDOMEN: Soft, non-tender, non-distended MUSCULOSKELETAL:  No edema; No deformity  SKIN: Warm and dry NEUROLOGIC:  Alert and oriented x 3 PSYCHIATRIC:  Normal affect   ASSESSMENT:    1. Coronary artery disease involving native coronary artery of native heart  without angina pectoris   2. Dyslipidemia   3. Tobacco use      PLAN:    CAD:  Calcium  score on 07/22/2019 was 88 (97th percentile).  Coronary CTA on 10/26/2019 showed nonobstructive CAD with calcified plaque in the proximal LAD causing mild (25 to 49%) stenosis. -Continue atorvastatin  40 mg daily   Hyperlipidemia: Continue atorvastatin  40 mg daily, LDL 66 on 01/07/2023.  Check lipid panel  Lightheadedness: Suspect related to vertigo.  Carotid duplex on 01/08/2020 shows mild bilateral stenosis in carotid arteries  Prediabetes: A1c 6.2% on 06/10/23  Tobacco use: Quit smoking in July 2022.  Congratulated patient on quitting smoking and encouraged continued cessation.  Reports she started vaping but has not vape for last month.  Recommended cessation  RTC in 1 year   Medication Adjustments/Labs and Tests Ordered: Current medicines are reviewed at length with the patient today.  Concerns regarding medicines are outlined above.  Orders Placed This Encounter  Procedures   Lipid Profile   EKG 12-Lead   No orders of the defined types were placed in this encounter.   Patient Instructions  Medication Instructions:  Your physician recommends that you continue on your current medications as directed. Please refer to the Current Medication list given to you today.  *If you need a refill on your cardiac medications before your next appointment, please call your pharmacy*  Lab Work: Today: lipid profile  If you  have any lab test that is abnormal or we need to change your treatment, we will call you to review the results.  Testing/Procedures: None ordered  Follow-Up: At [facility], you and your health needs are our priority.  As part of our continuing mission to provide you with exceptional heart care, our providers are all part of one team.  This team includes your primary Cardiologist (physician) and Advanced Practice Providers or APPs (Physician Assistants and Nurse Practitioners) who all work together to provide you with the care you need, when you need it.  Your next appointment:   1 year(s)  Provider:   Dr. Kate    Thank you for choosing [facility]!!   [phone]       Signed, Lonni LITTIE Kate, MD  02/27/2024 6:01 PM    [facility]

## 2024-02-27 ENCOUNTER — Encounter: Payer: Self-pay | Admitting: Cardiology

## 2024-02-27 ENCOUNTER — Ambulatory Visit: Attending: Cardiology | Admitting: Cardiology

## 2024-02-27 VITALS — BP 112/70 | HR 81 | Ht 64.0 in | Wt 209.2 lb

## 2024-02-27 DIAGNOSIS — Z720 Tobacco use: Secondary | ICD-10-CM

## 2024-02-27 DIAGNOSIS — E785 Hyperlipidemia, unspecified: Secondary | ICD-10-CM | POA: Diagnosis not present

## 2024-02-27 DIAGNOSIS — I2510 Atherosclerotic heart disease of native coronary artery without angina pectoris: Secondary | ICD-10-CM | POA: Diagnosis not present

## 2024-02-27 NOTE — Patient Instructions (Signed)
 Medication Instructions:  Your physician recommends that you continue on your current medications as directed. Please refer to the Current Medication list given to you today.  *If you need a refill on your cardiac medications before your next appointment, please call your pharmacy*  Lab Work: Today: lipid profile  If you have any lab test that is abnormal or we need to change your treatment, we will call you to review the results.  Testing/Procedures: None ordered  Follow-Up: At [facility], you and your health needs are our priority.  As part of our continuing mission to provide you with exceptional heart care, our providers are all part of one team.  This team includes your primary Cardiologist (physician) and Advanced Practice Providers or APPs (Physician Assistants and Nurse Practitioners) who all work together to provide you with the care you need, when you need it.  Your next appointment:   1 year(s)  Provider:   Dr. Kate    Thank you for choosing [facility]!!   [phone]

## 2024-02-28 ENCOUNTER — Ambulatory Visit: Payer: Self-pay | Admitting: Cardiology

## 2024-02-28 ENCOUNTER — Other Ambulatory Visit: Payer: Self-pay

## 2024-02-28 DIAGNOSIS — E785 Hyperlipidemia, unspecified: Secondary | ICD-10-CM

## 2024-02-28 LAB — LIPID PANEL
Chol/HDL Ratio: 3.2 ratio (ref 0.0–4.4)
Cholesterol, Total: 147 mg/dL (ref 100–199)
HDL: 46 mg/dL (ref >39)
LDL Chol Calc (NIH): 77 mg/dL (ref 0–99)
Triglycerides: 135 mg/dL (ref 0–149)
VLDL Cholesterol Cal: 24 mg/dL (ref 5–40)

## 2024-02-28 MED ORDER — ATORVASTATIN CALCIUM 80 MG PO TABS: 80.0000 mg | ORAL_TABLET | Freq: Every day | ORAL | 3 refills | Status: AC

## 2024-03-04 ENCOUNTER — Other Ambulatory Visit: Payer: Self-pay | Admitting: Family Medicine

## 2024-03-04 DIAGNOSIS — L649 Androgenic alopecia, unspecified: Secondary | ICD-10-CM

## 2024-04-06 ENCOUNTER — Telehealth: Payer: Self-pay | Admitting: Family Medicine

## 2024-04-06 NOTE — Telephone Encounter (Signed)
 Copied from CRM #[id number]. Topic: General - Other >> Apr 06, 2024  3:56 PM Robinson H wrote: Reason for CRM: Patient calling to see if she needs to come in for an appointment to have FMLA reverification paperwork updated, patient also states that one form needs to be backdated for 9/29.   Kimiko [phone]

## 2024-04-07 DIAGNOSIS — Z0279 Encounter for issue of other medical certificate: Secondary | ICD-10-CM

## 2024-04-07 NOTE — Telephone Encounter (Signed)
 No she doesn't need to come in for that, but her annual visit should be in December and it has not yet been scheduled. Please remind patient that she needs a visit in December. I have not received any paperwork yet to fill out on her also.

## 2024-04-07 NOTE — Telephone Encounter (Signed)
 Fax received and placed in the red folder.

## 2024-04-07 NOTE — Telephone Encounter (Signed)
 Spoke with the patient, informed her of the message below and scheduled a CPE on 12/10.  Patient stated she will have [employer] send paperwork and requests FMLA paperwork be completed as she missed work on 03/30/2024.

## 2024-04-10 NOTE — Telephone Encounter (Signed)
 Left a detailed message at the patient's cell number stating PCP completed the forms, charged a $29 fee and the original was left at the front desk for pick up.  Copy sent to be scanned.

## 2024-05-08 DIAGNOSIS — Z0279 Encounter for issue of other medical certificate: Secondary | ICD-10-CM

## 2024-05-12 ENCOUNTER — Telehealth: Payer: Self-pay | Admitting: *Deleted

## 2024-05-12 NOTE — Telephone Encounter (Signed)
 Paperwork received via fax from [place].  PCP completed the paperwork, charged a $29 fee and this was faxed to [phone].  Patient was informed of this, the original was left at the front desk and a copy was sent to be scanned.  Patient questioned if this was for the same leave in which paperwork was completed last time and paid $29 before?  I informed the patient of the leave #[id number] on this set of papers and she stated she will review when she comes in on Monday.

## 2024-05-13 ENCOUNTER — Other Ambulatory Visit: Payer: Self-pay | Admitting: Family Medicine

## 2024-05-13 DIAGNOSIS — L649 Androgenic alopecia, unspecified: Secondary | ICD-10-CM

## 2024-06-10 ENCOUNTER — Ambulatory Visit (INDEPENDENT_AMBULATORY_CARE_PROVIDER_SITE_OTHER): Admitting: Family Medicine

## 2024-06-10 ENCOUNTER — Encounter: Payer: Self-pay | Admitting: Family Medicine

## 2024-06-10 VITALS — BP 100/70 | HR 75 | Temp 97.8°F | Ht 65.0 in | Wt 211.1 lb

## 2024-06-10 DIAGNOSIS — Z0000 Encounter for general adult medical examination without abnormal findings: Secondary | ICD-10-CM | POA: Diagnosis not present

## 2024-06-10 DIAGNOSIS — R7303 Prediabetes: Secondary | ICD-10-CM | POA: Diagnosis not present

## 2024-06-10 DIAGNOSIS — G8929 Other chronic pain: Secondary | ICD-10-CM

## 2024-06-10 DIAGNOSIS — Z114 Encounter for screening for human immunodeficiency virus [HIV]: Secondary | ICD-10-CM

## 2024-06-10 DIAGNOSIS — Z1159 Encounter for screening for other viral diseases: Secondary | ICD-10-CM

## 2024-06-10 DIAGNOSIS — M549 Dorsalgia, unspecified: Secondary | ICD-10-CM

## 2024-06-10 DIAGNOSIS — E559 Vitamin D deficiency, unspecified: Secondary | ICD-10-CM | POA: Diagnosis not present

## 2024-06-10 DIAGNOSIS — L649 Androgenic alopecia, unspecified: Secondary | ICD-10-CM | POA: Diagnosis not present

## 2024-06-10 LAB — COMPREHENSIVE METABOLIC PANEL WITH GFR
ALT: 18 U/L (ref 0–35)
AST: 21 U/L (ref 0–37)
Albumin: 4.5 g/dL (ref 3.5–5.2)
Alkaline Phosphatase: 91 U/L (ref 39–117)
BUN: 10 mg/dL (ref 6–23)
CO2: 30 meq/L (ref 19–32)
Calcium: 9.7 mg/dL (ref 8.4–10.5)
Chloride: 105 meq/L (ref 96–112)
Creatinine, Ser: 0.87 mg/dL (ref 0.40–1.20)
GFR: 74.75 mL/min (ref >60.00)
Glucose, Bld: 115 mg/dL — ABNORMAL HIGH (ref 70–99)
Potassium: 4.4 meq/L (ref 3.5–5.1)
Sodium: 140 meq/L (ref 135–145)
Total Bilirubin: 0.5 mg/dL (ref 0.2–1.2)
Total Protein: 7.1 g/dL (ref 6.0–8.3)

## 2024-06-10 LAB — HEMOGLOBIN A1C: Hgb A1c MFr Bld: 6.3 % (ref 4.6–6.5)

## 2024-06-10 LAB — VITAMIN D 25 HYDROXY (VIT D DEFICIENCY, FRACTURES): VITD: 26.39 ng/mL — ABNORMAL LOW (ref 30.00–100.00)

## 2024-06-10 MED ORDER — DICLOFENAC SODIUM 1 % EX GEL: CUTANEOUS | 5 refills | Status: AC

## 2024-06-10 MED ORDER — FINASTERIDE 5 MG PO TABS: 5.0000 mg | ORAL_TABLET | Freq: Every day | ORAL | 1 refills | Status: AC

## 2024-06-10 NOTE — Patient Instructions (Addendum)

## 2024-06-10 NOTE — Progress Notes (Signed)
 Complete physical exam  Patient: Becky Moore   DOB: May 28, 1971   53 y.o. Female  MRN: [id number]  Subjective:    Chief Complaint  Patient presents with   Annual Exam    Becky Moore is a 53 y.o. female who presents today for a complete physical exam. She reports consuming a general diet. Eats fruits and veggies about 3 times per week, eats protein daily. Eats  fast food about twice a week, doesn't really eat chips or regular soda. Home exercise routine includes walking 1-2 hrs per week. She generally feels well. She reports sleeping fairly well. She does not have additional problems to discuss today.    Most recent fall risk assessment:    06/10/2023    4:47 PM  Fall Risk   Falls in the past year? 0  Number falls in past yr: 0  Injury with Fall? 0   Risk for fall due to : No Fall Risks  Follow up Falls evaluation completed     Data saved with a previous flowsheet row definition     Most recent depression screenings:    06/10/2024    8:47 AM 06/10/2023    4:35 PM  PHQ 2/9 Scores  PHQ - 2 Score 0 0  PHQ- 9 Score 0     Vision:Within last year and sees [employer] and Dental: No current dental problems and Receives regular dental care  Patient Active Problem List   Diagnosis Date Noted   Obesity (BMI 30-39.9) 02/07/2023   Prediabetes 08/09/2022   Atrial septal aneurysm 08/09/2022   Back pain 11/16/2016   Hx of adenomatous colonic polyps 10/01/2016   Dyslipidemia 10/01/2016   Benign positional vertigo, unspecified laterality 12/30/2009   DYSPNEA 12/01/2009   HIATAL HERNIA 08/31/2009   CHOLECYSTECTOMY, LAPAROSCOPIC, HX OF 08/18/2009   DYSPHAGIA UNSPECIFIED 08/15/2009   SORE THROAT 12/08/2007   GERD 09/04/2007      Patient Care Team: Ozell Heron HERO, MD as PCP - General (Family Medicine)   Outpatient Medications Prior to Visit  Medication Sig   atorvastatin  (LIPITOR) 80 MG tablet Take 1 tablet (80 mg total) by mouth daily.   fluticasone  (FLONASE ) 50  MCG/ACT nasal spray Place 2 sprays into both nostrils daily. (Patient taking differently: Place 2 sprays into both nostrils as needed for allergies.)   meclizine  (ANTIVERT ) 12.5 MG tablet Take 1 tablet (12.5 mg total) by mouth 3 (three) times daily as needed for dizziness.   [DISCONTINUED] diclofenac  Sodium (VOLTAREN ) 1 % GEL APPLY 2 GRAMS TO THE AFFECTED AREA(S) BY TOPICAL ROUTE 4 TIMES PER DAY   [DISCONTINUED] finasteride  (PROSCAR ) 5 MG tablet TAKE 1 TABLET (5 MG TOTAL) BY MOUTH DAILY.   [DISCONTINUED] methocarbamol  (ROBAXIN ) 500 MG tablet Take 1 tablet (500 mg total) by mouth 2 (two) times daily.   [DISCONTINUED] naproxen  (NAPROSYN ) 500 MG tablet Take 1 tablet (500 mg total) by mouth 2 (two) times daily.   No facility-administered medications prior to visit.    Review of Systems  HENT:  Negative for hearing loss.   Eyes:  Negative for blurred vision.  Respiratory:  Negative for shortness of breath.   Cardiovascular:  Negative for chest pain.  Gastrointestinal: Negative.   Genitourinary: Negative.   Musculoskeletal:  Negative for back pain.  Neurological:  Negative for headaches.  Psychiatric/Behavioral:  Negative for depression.        Objective:     BP 100/70   Pulse 75   Temp 97.8 F (36.6 C) (Oral)  Ht 5' 5 (1.651 m)   Wt 211 lb 1.6 oz (95.8 kg)   LMP 12/11/2017 (Approximate)   SpO2 98%   BMI 35.13 kg/m    Physical Exam Vitals reviewed.  Constitutional:      Appearance: Normal appearance. She is well-groomed. She is obese.  HENT:     Right Ear: Tympanic membrane and ear canal normal.     Left Ear: Tympanic membrane and ear canal normal.     Mouth/Throat:     Mouth: Mucous membranes are moist.     Pharynx: No posterior oropharyngeal erythema.  Eyes:     Conjunctiva/sclera: Conjunctivae normal.  Neck:     Thyroid : No thyromegaly.  Cardiovascular:     Rate and Rhythm: Normal rate and regular rhythm.     Pulses: Normal pulses.     Heart sounds: S1 normal and  S2 normal.  Pulmonary:     Effort: Pulmonary effort is normal.     Breath sounds: Normal breath sounds and air entry.  Abdominal:     General: Abdomen is flat. Bowel sounds are normal.     Palpations: Abdomen is soft.  Musculoskeletal:     Right lower leg: No edema.     Left lower leg: No edema.  Lymphadenopathy:     Cervical: No cervical adenopathy.  Neurological:     Mental Status: She is alert and oriented to person, place, and time. Mental status is at baseline.     Gait: Gait is intact.  Psychiatric:        Mood and Affect: Mood and affect normal.        Speech: Speech normal.        Behavior: Behavior normal.        Judgment: Judgment normal.      No results found for any visits on 06/10/24.     Assessment & Plan:    Routine Health Maintenance and Physical Exam  Immunization History  Administered Date(s) Administered   Influenza-Unspecified 04/28/2015   PFIZER(Purple Top)SARS-COV-2 Vaccination 06/27/2020   Td 09/04/2007   Tdap 08/20/2014, 07/07/2019   Zoster Recombinant(Shingrix) 02/28/2022    Health Maintenance  Topic Date Due   HIV Screening  Never done   Hepatitis C Screening  Never done   Pneumococcal Vaccine: 50+ Years (1 of 2 - PCV) Never done   Hepatitis B Vaccines 19-59 Average Risk (1 of 3 - 19+ 3-dose series) Never done   Cervical Cancer Screening (HPV/Pap Cotest)  07/25/2015   Zoster Vaccines- Shingrix (2 of 2) 04/25/2022   Mammogram  10/26/2023   COVID-19 Vaccine (2 - 2025-26 season) 03/02/2024   Influenza Vaccine  09/29/2024 (Originally 01/31/2024)   Colonoscopy  01/09/2028   DTaP/Tdap/Td (4 - Td or Tdap) 07/06/2029   HPV VACCINES  Aged Out   Meningococcal B Vaccine  Aged Out    Discussed health benefits of physical activity, and encouraged her to engage in regular exercise appropriate for her age and condition.  Encounter for screening for HIV -     HIV Antibody (routine testing w rflx); Future  Need for hepatitis C screening test -      Hepatitis C antibody; Future  Vitamin D  deficiency -     VITAMIN D  25 Hydroxy (Vit-D Deficiency, Fractures); Future  Prediabetes -     Hemoglobin A1c; Future -     Comprehensive metabolic panel with GFR; Future  Routine general medical examination at a health care facility  Chronic right-sided back pain, unspecified back location -  Diclofenac  Sodium; APPLY 2 GRAMS TO THE AFFECTED AREA(S) BY TOPICAL ROUTE 4 TIMES PER DAY  Dispense: 150 g; Refill: 5  Androgenic alopecia -     Finasteride ; Take 1 tablet (5 mg total) by mouth daily.  Dispense: 90 tablet; Refill: 1  General physical exam findings are normal today. I reviewed the patient's preventative testing, immunizations, and lifestyle habits. I made appropriate recommendations and placed orders for the appropriate tests and/or vaccinations. I counseled the patient on the CDC's recommendations for healthy exercise and diet. I counseled the patient on healthy sleep habits and stress management. Handouts to reinforce the counseling were given at the conclusion of the visit.    Return in about 1 year (around 06/10/2025) for annual physical exam.     Heron CHRISTELLA Sharper, MD

## 2024-06-11 LAB — HEPATITIS C ANTIBODY: Hepatitis C Ab: NONREACTIVE

## 2024-06-11 LAB — HIV ANTIBODY (ROUTINE TESTING W REFLEX)
HIV 1&2 Ab, 4th Generation: NONREACTIVE
HIV FINAL INTERPRETATION: NEGATIVE

## 2024-06-17 ENCOUNTER — Ambulatory Visit: Payer: Self-pay | Admitting: Family Medicine

## 2024-06-17 DIAGNOSIS — E559 Vitamin D deficiency, unspecified: Secondary | ICD-10-CM

## 2024-06-18 MED ORDER — VITAMIN D (ERGOCALCIFEROL) 1.25 MG (50000 UNIT) PO CAPS: 50000.0000 [IU] | ORAL_CAPSULE | ORAL | 1 refills | Status: AC
